# Patient Record
Sex: Female | Born: 1935 | Race: White | Hispanic: No | Marital: Married | Smoking: Former smoker
Health system: Southern US, Community
[De-identification: ages and names within clinical notes are randomized; demographics above are authoritative.]

## PROBLEM LIST (undated history)

## (undated) DIAGNOSIS — C50919 Malignant neoplasm of unspecified site of unspecified female breast: Secondary | ICD-10-CM

## (undated) DIAGNOSIS — G4731 Primary central sleep apnea: Secondary | ICD-10-CM

## (undated) DIAGNOSIS — E7800 Pure hypercholesterolemia, unspecified: Secondary | ICD-10-CM

## (undated) DIAGNOSIS — I10 Essential (primary) hypertension: Secondary | ICD-10-CM

## (undated) DIAGNOSIS — G4736 Sleep related hypoventilation in conditions classified elsewhere: Secondary | ICD-10-CM

## (undated) HISTORY — PX: APPENDECTOMY: SHX54

## (undated) HISTORY — DX: Sleep related hypoventilation in conditions classified elsewhere: G47.36

## (undated) HISTORY — PX: BREAST LUMPECTOMY: SHX2

## (undated) HISTORY — DX: Essential (primary) hypertension: I10

## (undated) HISTORY — DX: Primary central sleep apnea: G47.31

## (undated) HISTORY — DX: Pure hypercholesterolemia, unspecified: E78.00

## (undated) HISTORY — DX: Malignant neoplasm of unspecified site of unspecified female breast: C50.919

---

## 2008-02-24 ENCOUNTER — Ambulatory Visit: Payer: Self-pay | Admitting: Diagnostic Radiology

## 2008-02-24 ENCOUNTER — Ambulatory Visit (HOSPITAL_BASED_OUTPATIENT_CLINIC_OR_DEPARTMENT_OTHER): Admission: RE | Admit: 2008-02-24 | Discharge: 2008-02-24 | Payer: Self-pay | Admitting: Family Medicine

## 2010-05-22 ENCOUNTER — Ambulatory Visit (INDEPENDENT_AMBULATORY_CARE_PROVIDER_SITE_OTHER): Payer: Medicare Other | Admitting: Pulmonary Disease

## 2010-05-22 ENCOUNTER — Encounter: Payer: Self-pay | Admitting: Pulmonary Disease

## 2010-05-22 VITALS — BP 122/64 | HR 82 | Temp 98.7°F | Wt 211.6 lb

## 2010-05-22 DIAGNOSIS — G4730 Sleep apnea, unspecified: Secondary | ICD-10-CM

## 2010-05-22 DIAGNOSIS — G4731 Primary central sleep apnea: Secondary | ICD-10-CM

## 2010-05-22 HISTORY — DX: Primary central sleep apnea: G47.31

## 2010-05-22 NOTE — Assessment & Plan Note (Signed)
She has recent sleep study showing both obstructive and central sleep apnea.  She has a history of hypertension.  I have reviewed his sleep test results with the patient.  Explained how sleep apnea can affect the patient's health.  Driving precautions and importance of weight loss were discussed.  Treatment options for sleep apnea were reviewed.  Will proceed with CPAP titration study in lab.  Explained that she may also need to use supplemental oxygen, but this will determined after review of her titration study.

## 2010-05-22 NOTE — Patient Instructions (Signed)
Will schedule CPAP titration sleep study Will schedule follow up after sleep test reviewed

## 2010-05-22 NOTE — Progress Notes (Signed)
Subjective:    Patient ID: Kristy Parsons, female    DOB: July 10, 1933, 77 y.o.   MRN: [id number]  HPI 77 yo female for sleep evaluation.  She had breast surgery recently.  She had trouble recovering from anesthesia, and concerned was raised that she could have sleep apnea.  As a result she had a sleep test.  This was done at [facility] on Dec. 20, 2011.  This showed an AHI of 9.2 and SpO2 low of 77%.  This was associated with central events and consistent with mild complex sleep apnea.  There was some confusion as to what therapeutic options were available.  As a result she sought second opinion about what to do for her sleep apnea.  She goes to bed at 1am and falls asleep quickly.  She sleeps through the night.  She wakes up at 11am.  She feels good in the morning, and does not nap.  She does not use anything to help sleep or stay awake.  The patient denies sleep walking, sleep talking, bruxism, or nightmares.  There is no history of restless legs.  The patient denies sleep hallucinations, sleep paralysis, or cataplexy.  There is no history of thyroid disease.  She quit smoking years ago.  She does not drink alcohol.  Her weight has been steady.  Her Epworth score is 0.  Past Medical History  Diagnosis Date  . High blood pressure   . Asthma   . High cholesterol   . Breast cancer     DCIS     No family history on file.   History   Social History  . Marital Status: Married    Spouse Name: N/A    Number of Children: 2  . Years of Education: N/A   Occupational History  . house wife    Social History Main Topics  . Smoking status: Former Smoker -- 1.0 packs/day for 30 years    Types: Cigarettes    Quit date: 02/04/1980  . Smokeless tobacco: Not on file  . Alcohol Use: No  . Drug Use: No  . Sexually Active: Not on file   Other Topics Concern  . Not on file   Social History Narrative  . No narrative on file     Allergies  Allergen Reactions  . Bactrim  Rash  . Macrobid Rash     No outpatient prescriptions prior to visit.      Review of Systems     Objective:   Physical Exam Filed Vitals:   05/22/10 1549  BP: 122/64  Pulse: 82  Temp: 98.7 F (37.1 C)  TempSrc: Oral  Weight: 211 lb 9.6 oz (95.981 kg)  SpO2: 92%   General - Obese, healthy, no distress HEENT - PERRLA, EOMI, Narrow nasal angles, MP 2, decreased AP diameter, low laying soft palate, no LAN, no thyromegaly Cardiac - S1S2 regular, no murmur, peripheral pulses symmetric Chest - CTA Abd - soft, nontender, normal bowel sounds Ext - no E/C/C Neuro - normal strength, A&O x 3, CN intact Psych - normal mood and behavior       Assessment & Plan:   Complex sleep apnea syndrome She has recent sleep study showing both obstructive and central sleep apnea.  She has a history of hypertension.  I have reviewed his sleep test results with the patient.  Explained how sleep apnea can affect the patient's health.  Driving precautions and importance of weight loss were discussed.  Treatment options for sleep apnea  were reviewed.  Will proceed with CPAP titration study in lab.  Explained that she may also need to use supplemental oxygen, but this will determined after review of her titration study.    Updated Medication List Outpatient Encounter Prescriptions as of 05/22/2010  Medication Sig Dispense Refill  . amLODipine-valsartan (EXFORGE) 10-160 MG per tablet Take 1 tablet by mouth daily.        Marland Kitchen aspirin 81 MG tablet Take 81 mg by mouth daily.        . Calcium Carbonate 1500 MG TABS Take by mouth. Takes a total of 1700 mg a day       . fluticasone (FLOVENT HFA) 110 MCG/ACT inhaler Inhale 1 puff into the lungs 2 (two) times daily.        . Glucosamine 500 MG CAPS Take by mouth. Once a day       . Omega-3 Fatty Acids (FISH OIL) 1000 MG CAPS Take by mouth. Once a day       . rosuvastatin (CRESTOR) 5 MG tablet Take 5 mg by mouth daily.        . solifenacin (VESICARE) 5 MG  tablet 1/2 daily       . venlafaxine (EFFEXOR-XR) 75 MG 24 hr capsule Take 75 mg by mouth daily.

## 2010-06-18 ENCOUNTER — Ambulatory Visit (HOSPITAL_BASED_OUTPATIENT_CLINIC_OR_DEPARTMENT_OTHER): Payer: Medicare Other | Attending: Pulmonary Disease

## 2010-06-18 DIAGNOSIS — R259 Unspecified abnormal involuntary movements: Secondary | ICD-10-CM | POA: Insufficient documentation

## 2010-06-18 DIAGNOSIS — I10 Essential (primary) hypertension: Secondary | ICD-10-CM | POA: Insufficient documentation

## 2010-06-18 DIAGNOSIS — G4733 Obstructive sleep apnea (adult) (pediatric): Secondary | ICD-10-CM | POA: Insufficient documentation

## 2010-07-05 ENCOUNTER — Telehealth: Payer: Self-pay | Admitting: Pulmonary Disease

## 2010-07-05 NOTE — Telephone Encounter (Signed)
Dr Craige Cotta, please advise results, thanks

## 2010-07-09 ENCOUNTER — Telehealth: Payer: Self-pay | Admitting: Pulmonary Disease

## 2010-07-09 DIAGNOSIS — IMO0002 Reserved for concepts with insufficient information to code with codable children: Secondary | ICD-10-CM

## 2010-07-09 DIAGNOSIS — G4736 Sleep related hypoventilation in conditions classified elsewhere: Secondary | ICD-10-CM

## 2010-07-09 DIAGNOSIS — G4731 Primary central sleep apnea: Secondary | ICD-10-CM

## 2010-07-09 HISTORY — DX: Reserved for concepts with insufficient information to code with codable children: IMO0002

## 2010-07-09 NOTE — Telephone Encounter (Signed)
Discussed sleep study findings with pt.

## 2010-07-09 NOTE — Telephone Encounter (Signed)
She had titration to CPAP 9 cm H2O with control of apnea.  Had evidence for hypoventilation with oxygen desaturation in absence of apneic events.  Did well with additional of 2 liters oxygen.  Will proceed with CPAP 9 cm H2O and 2 liters oxygen.  Order sent to [employer] to set up DME.  Will have my nurse call to schedule ROV 8 weeks after set up.

## 2010-07-10 DIAGNOSIS — I10 Essential (primary) hypertension: Secondary | ICD-10-CM

## 2010-07-10 DIAGNOSIS — G4733 Obstructive sleep apnea (adult) (pediatric): Secondary | ICD-10-CM

## 2010-07-10 DIAGNOSIS — R259 Unspecified abnormal involuntary movements: Secondary | ICD-10-CM

## 2010-07-10 NOTE — Procedures (Addendum)
Kristy Parsons, CUPPETT NO.:  [id number]  MEDICAL RECORD NO.:  [id number]          PATIENT TYPE:  OUT  LOCATION:  SLEEP CENTER                 FACILITY:  [facility]  PHYSICIAN:  Coralyn Helling, MD        DATE OF BIRTH:  04-Aug-1933  DATE OF STUDY:  06/18/2010                           NOCTURNAL POLYSOMNOGRAM  REFERRING PHYSICIAN:  Coralyn Helling, MD  INDICATION FOR STUDY:  [name] is a 77 year old female who has a history of hypertension.  She also has sleep disruption, snoring, and daytime sleepiness.  She had undergone a recent overnight polysomnogram on January 22, 2010, and was found to have an apnea/hypopnea index of 9.2.  She is referred to the sleep lab for a CPAP titration study.  Height is 5 feet 7 inches, weight is 211 pounds, BMI is 33, neck size is 15 inches.  EPWORTH SLEEPINESS SCORE:  0.  MEDICATIONS:  Exforge, calcium, Flovent, glucosamine, fish oil, Crestor, VESIcare, and Effexor XR.  SLEEP ARCHITECTURE:  Total recording time was 390 minutes.  Total sleep time was 103 minutes.  Sleep efficiency was 26%.  Sleep latency 73 minutes.  The study was notable for lack of stage III sleep and REM sleep.  The patient slept exclusively in the non-supine position.  RESPIRATORY DATA:  The average respiratory rate was 19.  The patient was started on CPAP of 5 cm of water and increased to 9 cm of water.  With CPAP at 9 cm of water, the apnea/hypopnea index was 0.  However, the patient was not observed in REM sleep or supine sleep.  OXYGEN DATA:  The baseline oxygenation was 91%.  The oxygen saturation nadir was 82%.  The patient was noted to have significant oxygen desaturations of longer duration without evidence for apneic events.  As a result, she was started on 2 liters of supplemental oxygen.  With a combination of CPAP at 9 cm of water and 2 liters supplemental oxygen, she was able to maintain her oxygen saturation.  CARDIAC DATA:  The average heart rate  was 68 and the rhythm strip showed sinus rhythm with occasional PVCs.  MOVEMENT-PARASOMNIA:  The periodic limb movement index was 132, and the patient had no restroom trips.  IMPRESSIONS-RECOMMENDATIONS:  The patient had successful control of her obstructive sleep apnea with CPAP set at 9 cm of water.  In addition, she also appeared to have evidence for sleep-related hypoventilation which was successfully controlled with combination of CPAP at 9 cm of water and 2 liters of supplemental oxygen.  She also had a significant increase in the periodic limb movement index and clinical correlation would be necessary to determine the significance of this.  In addition to diet, exercise, and weight reduction, I would recommend the patient be started on CPAP at 9 cm of water with 2 liters of supplemental oxygen and monitored for her clinical response.     Coralyn Helling, MD Diplomat, American Board of Sleep Medicine Electronically Signed    VS/MEDQ  D:  07/09/2010 14:53:49  T:  07/10/2010 04:53:34  Job:  [id number]

## 2010-07-15 NOTE — Telephone Encounter (Signed)
Pt says she has not heard anything from DME, [employer]. Will forward to [facility]'s so they may find out when pt will be set up on CPAP.

## 2010-08-01 ENCOUNTER — Telehealth: Payer: Self-pay | Admitting: Pulmonary Disease

## 2010-08-01 DIAGNOSIS — G4731 Primary central sleep apnea: Secondary | ICD-10-CM

## 2010-08-01 NOTE — Telephone Encounter (Signed)
Pt c/o 9 being too much pressure. States she does press the Ramp button at night and it wakes her up. Also c/o "air blowing" across her body from her mask. Please advise. Thanks

## 2010-08-01 NOTE — Telephone Encounter (Signed)
Please inform patient that we will send order to her DME to decrease CPAP pressure to 8 cm H2O.  (I have sent this order).

## 2010-08-01 NOTE — Telephone Encounter (Signed)
I informed pt of VS's findings and recommendations. Pt verbalized understanding  

## 2010-08-26 ENCOUNTER — Encounter: Payer: Self-pay | Admitting: Pulmonary Disease

## 2010-09-05 ENCOUNTER — Encounter: Payer: Self-pay | Admitting: Pulmonary Disease

## 2010-09-09 ENCOUNTER — Ambulatory Visit (INDEPENDENT_AMBULATORY_CARE_PROVIDER_SITE_OTHER): Payer: Medicare Other | Admitting: Pulmonary Disease

## 2010-09-09 ENCOUNTER — Encounter: Payer: Self-pay | Admitting: Pulmonary Disease

## 2010-09-09 VITALS — BP 112/70 | HR 69 | Temp 98.1°F | Wt 215.0 lb

## 2010-09-09 DIAGNOSIS — G4731 Primary central sleep apnea: Secondary | ICD-10-CM

## 2010-09-09 DIAGNOSIS — G4736 Sleep related hypoventilation in conditions classified elsewhere: Secondary | ICD-10-CM

## 2010-09-09 DIAGNOSIS — G4739 Other sleep apnea: Secondary | ICD-10-CM

## 2010-09-09 DIAGNOSIS — G4730 Sleep apnea, unspecified: Secondary | ICD-10-CM

## 2010-09-09 NOTE — Patient Instructions (Signed)
Will change CPAP pressure to 6 cm H2O Will arrange for oxygen to be set up at 2 liters, and to be used with CPAP machine while asleep Will arrange for new CPAP mask Follow up in 4 to 6 weeks

## 2010-09-09 NOTE — Assessment & Plan Note (Signed)
She had continued oxygen desaturation during titration study in absence of obstructive apneic events.  She needs to use oxygen with CPAP.  She has not been set up with oxygen yet.  Will contact her DME company to ensure that oxygen is set up with CPAP.

## 2010-09-09 NOTE — Progress Notes (Signed)
  Subjective:    Patient ID: Kristy Parsons, female    DOB: 1934/02/03, 77 y.o.   MRN: [id number]  HPI 77 yo female with Complex sleep apnea, and OHS.  She has not been able to tolerate CPAP.  She has an nasal pillows mask.  She can fall asleep with CPAP with ramping procedure.  However, she is woken up when the pressure increases.  This has continued in spite of decreasing pressure from 9 to 8 cm H2O.  She has also noticed a moldy smell from her mask.  She was never set up with home oxygen.     Review of Systems     Objective:   Physical Exam BP 112/70  Pulse 69  Temp(Src) 98.1 F (36.7 C) (Oral)  Wt 215 lb (97.523 kg)  SpO2 92%  General - Obese, healthy, no distress  HEENT - Narrow nasal angles, MP 2, decreased AP diameter, low laying soft palate, no LAN  Cardiac - S1S2 regular, no murmur Chest - CTA  Abd - soft, nontender, normal bowel sounds  Ext - no E/C/C  Neuro - normal strength, A&O x 3, CN intact  Psych - normal mood and behavior   PSG from [facility] 01/22/10>>AHI 9.2, SpO2 low 77%.  Obstructive and central events. CPAP titration 06/18/10>>CPAP 9 cm H2O, 2 liters oxygen. CPAP download 07/25/10 to 08/23/10>>used on 15 of 30 nights with average 1 hrs 24 min.  Average AHI 1.1 with CPAP 8 cm H2O.    Assessment & Plan:

## 2010-09-09 NOTE — Assessment & Plan Note (Addendum)
She is able to fall asleep with CPAP, but feels that the pressure is too high after ramping procedure in spite of decreasing pressure from 9 to 8 cm H2O.    Will decreased pressure to 6 cm H2O.    Will arrange for new CPAP mask, and advised her to try using CPAP w/o humidifer to determine if this improves the problem with small from her makes.  If these changes are not successful, may then need to consider evaluation for oral appliance.

## 2010-10-04 ENCOUNTER — Telehealth: Payer: Self-pay | Admitting: *Deleted

## 2010-10-04 DIAGNOSIS — G4733 Obstructive sleep apnea (adult) (pediatric): Secondary | ICD-10-CM

## 2010-10-04 NOTE — Telephone Encounter (Signed)
Okay to send order for ONO with CPAP.

## 2010-10-04 NOTE — Telephone Encounter (Signed)
Spoke with Kristy Parsons [facility]) and she states in order for pt to get set up with o2 with CPAP pt will need ONO. Sleep study data from 06/18/2010 needs to be w/in 30 days to qualify and pt did not have OV. Please advise If I can send order. Thanks  Carver Fila, CMA

## 2010-10-04 NOTE — Telephone Encounter (Signed)
Order was sent to [facility] 

## 2010-10-24 ENCOUNTER — Telehealth: Payer: Self-pay | Admitting: Pulmonary Disease

## 2010-10-24 DIAGNOSIS — G4731 Primary central sleep apnea: Secondary | ICD-10-CM

## 2010-10-24 DIAGNOSIS — G4736 Sleep related hypoventilation in conditions classified elsewhere: Secondary | ICD-10-CM

## 2010-10-24 NOTE — Telephone Encounter (Signed)
Called and spoke with pt.  Pt was calling VS back to discuss her ONO results in detail (see results dated 10/24/10 in pt's chart) informed her of these results.  Pt verbalized understanding and denied any questions.  Nothing further needed.

## 2010-10-24 NOTE — Telephone Encounter (Signed)
ONO 10/22/10 with CPAP and room air>>Test time 8hrs 49 min.  Basal SpO2 91%, low SpO2 79%.  Spent 7 hrs 6 min (80.6%) with SpO2 < 88%.  Left message on pt's voicemail explain ONO results, and needed for supplemental oxygen at 2 liters with CPAP.  Will repeat ONO with CPAP and 2 liters oxygen.  Advised pt to call back if she has questions.

## 2010-10-30 ENCOUNTER — Encounter: Payer: Self-pay | Admitting: Pulmonary Disease

## 2019-02-28 ENCOUNTER — Ambulatory Visit: Payer: Medicare Other

## 2020-01-11 ENCOUNTER — Other Ambulatory Visit: Payer: Self-pay

## 2020-01-11 ENCOUNTER — Emergency Department (HOSPITAL_COMMUNITY)
Admission: EM | Admit: 2020-01-11 | Discharge: 2020-01-11 | Disposition: A | Discharge status: Home / Self Care | Payer: Medicare Other | Attending: Emergency Medicine | Admitting: Emergency Medicine

## 2020-01-11 ENCOUNTER — Emergency Department (HOSPITAL_COMMUNITY): Payer: Medicare Other

## 2020-01-11 ENCOUNTER — Encounter (HOSPITAL_COMMUNITY): Payer: Self-pay | Admitting: Emergency Medicine

## 2020-01-11 DIAGNOSIS — Z7982 Long term (current) use of aspirin: Secondary | ICD-10-CM | POA: Diagnosis not present

## 2020-01-11 DIAGNOSIS — Z7952 Long term (current) use of systemic steroids: Secondary | ICD-10-CM | POA: Insufficient documentation

## 2020-01-11 DIAGNOSIS — W19XXXA Unspecified fall, initial encounter: Secondary | ICD-10-CM

## 2020-01-11 DIAGNOSIS — Y9389 Activity, other specified: Secondary | ICD-10-CM | POA: Insufficient documentation

## 2020-01-11 DIAGNOSIS — Y92512 Supermarket, store or market as the place of occurrence of the external cause: Secondary | ICD-10-CM | POA: Insufficient documentation

## 2020-01-11 DIAGNOSIS — W01198A Fall on same level from slipping, tripping and stumbling with subsequent striking against other object, initial encounter: Secondary | ICD-10-CM | POA: Diagnosis not present

## 2020-01-11 DIAGNOSIS — Z853 Personal history of malignant neoplasm of breast: Secondary | ICD-10-CM | POA: Insufficient documentation

## 2020-01-11 DIAGNOSIS — S0990XA Unspecified injury of head, initial encounter: Secondary | ICD-10-CM

## 2020-01-11 DIAGNOSIS — Z23 Encounter for immunization: Secondary | ICD-10-CM | POA: Insufficient documentation

## 2020-01-11 DIAGNOSIS — J45909 Unspecified asthma, uncomplicated: Secondary | ICD-10-CM | POA: Diagnosis not present

## 2020-01-11 DIAGNOSIS — S0181XA Laceration without foreign body of other part of head, initial encounter: Secondary | ICD-10-CM

## 2020-01-11 DIAGNOSIS — S01112A Laceration without foreign body of left eyelid and periocular area, initial encounter: Secondary | ICD-10-CM | POA: Insufficient documentation

## 2020-01-11 DIAGNOSIS — Z87891 Personal history of nicotine dependence: Secondary | ICD-10-CM | POA: Diagnosis not present

## 2020-01-11 MED ORDER — TETANUS-DIPHTH-ACELL PERTUSSIS 5-2.5-18.5 LF-MCG/0.5 IM SUSY
0.5000 mL | PREFILLED_SYRINGE | Freq: Once | INTRAMUSCULAR | Status: AC
  Administered 2020-01-11: 0.5 mL via INTRAMUSCULAR
  Filled 2020-01-11: qty 0.5

## 2020-01-11 MED ORDER — LIDOCAINE HCL (PF) 1 % IJ SOLN
5.0000 mL | Freq: Once | INTRAMUSCULAR | Status: DC
  Filled 2020-01-11: qty 30

## 2020-01-11 NOTE — ED Provider Notes (Signed)
[place] DEPT Provider Note   CSN: [id number] Arrival date & time: 01/11/20  1449     History Chief Complaint  Patient presents with  . Fall  . Head Laceration    Kristy Parsons is a 86 y.o. female.  HPI Patient presents after fall.  States she tripped and fell at [employer].  Hit her left forehead.  No loss conscious.  States she landed on left side and some mild pain on that side but no severe pain.  No loss conscious.  Not on anticoagulation.  Does have a laceration left eyebrow.  Unknown last tetanus.  No visual disturbance.  No neck pain.  Has been ambulatory since the event.    Past Medical History:  Diagnosis Date  . Asthma   . Breast cancer ([place])    DCIS  . Complex sleep apnea syndrome 05/22/2010  . High blood pressure   . High cholesterol   . Sleep-related hypoventilation 07/09/2010    Patient Active Problem List   Diagnosis Date Noted  . Sleep-related hypoventilation 07/09/2010  . Complex sleep apnea syndrome 05/22/2010    Past Surgical History:  Procedure Laterality Date  . APPENDECTOMY    . BREAST LUMPECTOMY     right breast     OB History   No obstetric history on file.     History reviewed. No pertinent family history.  Social History   Tobacco Use  . Smoking status: Former Smoker    Packs/day: 1.00    Years: 30.00    Pack years: 30.00    Types: Cigarettes    Quit date: 02/04/1980    Years since quitting: 39.9  Substance Use Topics  . Alcohol use: No  . Drug use: No    Home Medications Prior to Admission medications   Medication Sig Start Date End Date Taking? Authorizing Provider  acetaminophen (TYLENOL) 500 MG tablet Take 500 mg by mouth every 6 (six) hours as needed for moderate pain.   Yes [provider]  amLODipine-valsartan (EXFORGE) 10-160 MG per tablet Take 1 tablet by mouth daily.     Yes [provider]  aspirin 81 MG tablet Take 81 mg by mouth daily.     Yes [provider]  Calcium Carbonate 1500 MG TABS Take 1,500 mg by mouth daily.    Yes [provider]  Carboxymethylcellulose Sodium (REFRESH PLUS OP) Place 1 drop into both eyes daily as needed (dry eyes).   Yes [provider]  clobetasol (TEMOVATE) 0.05 % external solution Apply 1 application topically 2 (two) times daily as needed (itching on scalp).  10/18/14  Yes [provider]  fluticasone furoate-vilanterol (BREO ELLIPTA) 100-25 MCG/INH AEPB Inhale 1 puff into the lungs daily.  11/29/19  Yes [provider]  Glucosamine 500 MG CAPS Take 500 mg by mouth daily. Once a day    Yes [provider]  omeprazole (PRILOSEC) 40 MG capsule Take 40 mg by mouth daily. 12/08/19  Yes [provider]  rosuvastatin (CRESTOR) 5 MG tablet Take 5 mg by mouth daily.     Yes [provider]  Trospium Chloride 60 MG CP24 Take 60 mg by mouth daily.   Yes [provider]  venlafaxine (EFFEXOR-XR) 75 MG 24 hr capsule Take 75 mg by mouth daily.     Yes [provider]    Allergies    Bactrim, Nitrofurantoin monohyd macro, Sulfa antibiotics, Azithromycin, Cortisone, and Ketorolac  Review of Systems  Review of Systems  Constitutional: Negative for appetite change.  HENT: Negative for congestion.   Respiratory: Negative for shortness of breath.   Gastrointestinal: Negative for abdominal pain.  Genitourinary: Negative for flank pain.  Musculoskeletal: Negative for back pain.  Skin: Positive for wound.  Neurological: Negative for weakness and headaches.  Psychiatric/Behavioral: Negative for confusion.    Physical Exam Updated Vital Signs BP (!) 158/70   Pulse 67   Temp 98.4 F (36.9 C) (Oral)   Resp 18   Ht 5\' 5"  (1.651 m)   Wt 90.7 kg   SpO2 93%   BMI 33.28 kg/m   Physical Exam Vitals and nursing note reviewed.  Constitutional:      Appearance: Normal appearance.  HENT:     Head:     Comments: 3 cm horizontal  laceration through left eyebrow.  Able to raise eyebrow.  Eye movements intact. Cardiovascular:     Rate and Rhythm: Regular rhythm.  Pulmonary:     Breath sounds: Normal breath sounds.  Chest:     Chest wall: No tenderness.  Abdominal:     Tenderness: There is no abdominal tenderness.  Musculoskeletal:        General: No tenderness.     Cervical back: Neck supple.  Skin:    General: Skin is warm.     Capillary Refill: Capillary refill takes less than 2 seconds.  Neurological:     Mental Status: She is alert and oriented to person, place, and time.  Psychiatric:        Mood and Affect: Mood normal.     ED Results / Procedures / Treatments   Labs (all labs ordered are listed, but only abnormal results are displayed) Labs Reviewed - No data to display  EKG None  Radiology CT Head Wo Contrast  Result Date: 01/11/2020 CLINICAL DATA:  Trauma. EXAM: CT HEAD WITHOUT CONTRAST TECHNIQUE: Contiguous axial images were obtained from the base of the skull through the vertex without intravenous contrast. COMPARISON:  CT head December 23, 2017. FINDINGS: Brain: No evidence of acute large vascular territory infarction, hemorrhage, hydrocephalus, extra-axial collection or mass lesion/mass effect. Chronic encephalomalacia in the right frontal lobe, similar to prior. Similar generalized cerebral atrophy with volume loss. Patchy white matter hypodensities, which are nonspecific but most likely relate to chronic microvascular ischemic disease. Vascular: Calcific atherosclerosis. Skull: Left frontal/periorbital contusion/laceration. No acute fracture. Hyperostosis frontalis. Sinuses/Orbits: Visualized sinuses are clear.  Unremarkable orbits. Other: No mastoid effusions. IMPRESSION: 1. No evidence of acute intracranial abnormality. 2. Left frontal/periorbital contusion/laceration without acute fracture. 3. Chronic microvascular ischemic disease, remote right frontal infarct, and generalized cerebral  atrophy. Electronically Signed   By: Margaretha Sheffield MD   On: 01/11/2020 18:42    Procedures .Marland KitchenLaceration Repair  Date/Time: 01/11/2020 7:26 PM Performed by: Davonna Belling, MD Authorized by: Davonna Belling, MD   Consent:    Consent obtained:  Verbal   Consent given by:  Patient   Risks discussed:  Infection, pain, retained foreign body, vascular damage, poor wound healing, poor cosmetic result, need for additional repair and nerve damage   Alternatives discussed:  No treatment, delayed treatment and observation Anesthesia (see MAR for exact dosages):    Anesthesia method:  Local infiltration   Local anesthetic:  Lidocaine 1% w/o epi Laceration details:    Location:  Face   Face location:  L eyebrow   Length (cm):  3 Repair type:    Repair type:  Intermediate Pre-procedure details:    Preparation:  Patient was prepped and draped in usual sterile fashion and imaging obtained to evaluate for foreign bodies Exploration:    Hemostasis achieved with:  Direct pressure   Wound exploration: wound explored through full range of motion and entire depth of wound probed and visualized     Wound extent: no tendon damage noted and no underlying fracture noted     Contaminated: no   Treatment:    Area cleansed with:  Saline   Amount of cleaning:  Standard   Irrigation method:  Syringe Subcutaneous repair:    Suture size:  5-0   Suture material:  Vicryl   Suture technique:  Simple interrupted   Number of sutures:  3 Skin repair:    Repair method:  Sutures   Suture size:  5-0   Suture material:  Prolene   Suture technique:  Simple interrupted   Number of sutures:  13 Post-procedure details:    Dressing:  Sterile dressing   Patient tolerance of procedure:  Tolerated well, no immediate complications   (including critical care time)  Medications Ordered in ED Medications  lidocaine (PF) (XYLOCAINE) 1 % injection 5 mL (has no administration in time range)  Tdap (BOOSTRIX)  injection 0.5 mL (0.5 mLs Intramuscular Given 01/11/20 1736)    ED Course  I have reviewed the triage vital signs and the nursing notes.  Pertinent labs & imaging results that were available during my care of the patient were reviewed by me and considered in my medical decision making (see chart for details).    MDM Rules/Calculators/A&P                         Patient mechanical fall.  Tripped over a little bump at the store.  Hit her left forehead.  Approximately 3 cm laceration closed.  Head CT done reassuring.  No intracranial hemorrhage.  No fracture.  No other injury.  Had been complaining of mild left-sided pain on her body but no tenderness to chest or abdomen.  Not on anticoagulation.  Do not think she needs further imaging.  No intrathoracic or intra-abdominal injury expected.  Discharge home. Final Clinical Impression(s) / ED Diagnoses Final diagnoses:  Fall, initial encounter  Laceration of forehead, initial encounter  Injury of head, initial encounter    Rx / DC Orders ED Discharge Orders    None       Davonna Belling, MD 01/11/20 1928

## 2020-01-11 NOTE — ED Triage Notes (Signed)
Pt states that she had a mechanical fall while shopping and now has a laceration over her L eyebrow. Denies blood thinners or LOC. Alert and oriented.

## 2020-01-11 NOTE — Discharge Instructions (Signed)
Have the stitches taken out in around 5 days.

## 2022-01-21 ENCOUNTER — Encounter (HOSPITAL_BASED_OUTPATIENT_CLINIC_OR_DEPARTMENT_OTHER): Payer: Self-pay

## 2022-01-21 ENCOUNTER — Emergency Department (HOSPITAL_BASED_OUTPATIENT_CLINIC_OR_DEPARTMENT_OTHER): Payer: Medicare Other

## 2022-01-21 ENCOUNTER — Other Ambulatory Visit: Payer: Self-pay

## 2022-01-21 ENCOUNTER — Inpatient Hospital Stay (HOSPITAL_BASED_OUTPATIENT_CLINIC_OR_DEPARTMENT_OTHER)
Admission: EM | Admit: 2022-01-21 | Discharge: 2022-01-26 | DRG: 492 | Disposition: A | Discharge status: Skilled Nursing Facility | Payer: Medicare Other | Attending: Osteopathic Medicine | Admitting: Osteopathic Medicine

## 2022-01-21 DIAGNOSIS — E876 Hypokalemia: Secondary | ICD-10-CM | POA: Diagnosis present

## 2022-01-21 DIAGNOSIS — R9431 Abnormal electrocardiogram [ECG] [EKG]: Secondary | ICD-10-CM

## 2022-01-21 DIAGNOSIS — Z882 Allergy status to sulfonamides status: Secondary | ICD-10-CM

## 2022-01-21 DIAGNOSIS — Z7982 Long term (current) use of aspirin: Secondary | ICD-10-CM

## 2022-01-21 DIAGNOSIS — Y92410 Unspecified street and highway as the place of occurrence of the external cause: Secondary | ICD-10-CM

## 2022-01-21 DIAGNOSIS — I160 Hypertensive urgency: Secondary | ICD-10-CM | POA: Diagnosis present

## 2022-01-21 DIAGNOSIS — S82141A Displaced bicondylar fracture of right tibia, initial encounter for closed fracture: Secondary | ICD-10-CM | POA: Diagnosis not present

## 2022-01-21 DIAGNOSIS — Z87891 Personal history of nicotine dependence: Secondary | ICD-10-CM

## 2022-01-21 DIAGNOSIS — Z853 Personal history of malignant neoplasm of breast: Secondary | ICD-10-CM

## 2022-01-21 DIAGNOSIS — S82143A Displaced bicondylar fracture of unspecified tibia, initial encounter for closed fracture: Secondary | ICD-10-CM | POA: Diagnosis not present

## 2022-01-21 DIAGNOSIS — I4892 Unspecified atrial flutter: Secondary | ICD-10-CM | POA: Diagnosis present

## 2022-01-21 DIAGNOSIS — D62 Acute posthemorrhagic anemia: Secondary | ICD-10-CM | POA: Diagnosis present

## 2022-01-21 DIAGNOSIS — J449 Chronic obstructive pulmonary disease, unspecified: Secondary | ICD-10-CM

## 2022-01-21 DIAGNOSIS — G4733 Obstructive sleep apnea (adult) (pediatric): Secondary | ICD-10-CM | POA: Diagnosis present

## 2022-01-21 DIAGNOSIS — E7800 Pure hypercholesterolemia, unspecified: Secondary | ICD-10-CM | POA: Diagnosis present

## 2022-01-21 DIAGNOSIS — J9611 Chronic respiratory failure with hypoxia: Secondary | ICD-10-CM

## 2022-01-21 DIAGNOSIS — J4489 Other specified chronic obstructive pulmonary disease: Secondary | ICD-10-CM | POA: Diagnosis present

## 2022-01-21 DIAGNOSIS — I10 Essential (primary) hypertension: Secondary | ICD-10-CM | POA: Diagnosis present

## 2022-01-21 DIAGNOSIS — Z79899 Other long term (current) drug therapy: Secondary | ICD-10-CM

## 2022-01-21 DIAGNOSIS — D72829 Elevated white blood cell count, unspecified: Secondary | ICD-10-CM

## 2022-01-21 DIAGNOSIS — I4891 Unspecified atrial fibrillation: Secondary | ICD-10-CM | POA: Diagnosis present

## 2022-01-21 DIAGNOSIS — Z888 Allergy status to other drugs, medicaments and biological substances status: Secondary | ICD-10-CM

## 2022-01-21 DIAGNOSIS — I471 Supraventricular tachycardia, unspecified: Secondary | ICD-10-CM | POA: Diagnosis present

## 2022-01-21 DIAGNOSIS — G4731 Primary central sleep apnea: Secondary | ICD-10-CM

## 2022-01-21 DIAGNOSIS — J9621 Acute and chronic respiratory failure with hypoxia: Secondary | ICD-10-CM | POA: Diagnosis not present

## 2022-01-21 DIAGNOSIS — J189 Pneumonia, unspecified organism: Secondary | ICD-10-CM | POA: Diagnosis not present

## 2022-01-21 LAB — CBC WITH DIFFERENTIAL/PLATELET
Abs Immature Granulocytes: 0.13 10*3/uL — ABNORMAL HIGH (ref 0.00–0.07)
Basophils Absolute: 0 10*3/uL (ref 0.0–0.1)
Basophils Relative: 0 %
Eosinophils Absolute: 0.1 10*3/uL (ref 0.0–0.5)
Eosinophils Relative: 0 %
HCT: 46 % (ref 36.0–46.0)
Hemoglobin: 14.6 g/dL (ref 12.0–15.0)
Immature Granulocytes: 1 %
Lymphocytes Relative: 6 %
Lymphs Abs: 1 10*3/uL (ref 0.7–4.0)
MCH: 27.9 pg (ref 26.0–34.0)
MCHC: 31.7 g/dL (ref 30.0–36.0)
MCV: 87.8 fL (ref 80.0–100.0)
Monocytes Absolute: 1.3 10*3/uL — ABNORMAL HIGH (ref 0.1–1.0)
Monocytes Relative: 7 %
Neutro Abs: 15.4 10*3/uL — ABNORMAL HIGH (ref 1.7–7.7)
Neutrophils Relative %: 86 %
Platelets: 232 10*3/uL (ref 150–400)
RBC: 5.24 MIL/uL — ABNORMAL HIGH (ref 3.87–5.11)
RDW: 14.8 % (ref 11.5–15.5)
WBC: 17.9 10*3/uL — ABNORMAL HIGH (ref 4.0–10.5)
nRBC: 0 % (ref 0.0–0.2)

## 2022-01-21 LAB — COMPREHENSIVE METABOLIC PANEL
ALT: 25 U/L (ref 0–44)
AST: 39 U/L (ref 15–41)
Albumin: 3.6 g/dL (ref 3.5–5.0)
Alkaline Phosphatase: 80 U/L (ref 38–126)
Anion gap: 8 (ref 5–15)
BUN: 13 mg/dL (ref 8–23)
CO2: 27 mmol/L (ref 22–32)
Calcium: 9.1 mg/dL (ref 8.9–10.3)
Chloride: 108 mmol/L (ref 98–111)
Creatinine, Ser: 0.79 mg/dL (ref 0.44–1.00)
GFR, Estimated: >60 mL/min (ref >60)
Glucose, Bld: 168 mg/dL — ABNORMAL HIGH (ref 70–99)
Potassium: 3.1 mmol/L — ABNORMAL LOW (ref 3.5–5.1)
Sodium: 143 mmol/L (ref 135–145)
Total Bilirubin: 0.9 mg/dL (ref 0.3–1.2)
Total Protein: 6.3 g/dL — ABNORMAL LOW (ref 6.5–8.1)

## 2022-01-21 MED ORDER — HYDROMORPHONE HCL 1 MG/ML IJ SOLN
0.5000 mg | Freq: Once | INTRAMUSCULAR | Status: AC
  Administered 2022-01-21: 0.5 mg via INTRAVENOUS
  Filled 2022-01-21: qty 1

## 2022-01-21 NOTE — ED Notes (Signed)
Patient placed in C-collar by EMS.
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
## 2022-01-21 NOTE — ED Notes (Signed)
Patient transported to CT 

## 2022-01-21 NOTE — ED Notes (Signed)
This RN noticed patient's distal phalange of middle finger on right hand completely bruised, swollen, and "tight." Bruising was not present on initial assessment. Patient denies finger pain; notified ED provider Meghan, PA-C.
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
## 2022-01-21 NOTE — ED Notes (Signed)
Patient transported to X-ray 
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
## 2022-01-22 ENCOUNTER — Inpatient Hospital Stay (HOSPITAL_COMMUNITY): Payer: Medicare Other

## 2022-01-22 ENCOUNTER — Inpatient Hospital Stay (HOSPITAL_COMMUNITY): Payer: Medicare Other | Admitting: Certified Registered Nurse Anesthetist

## 2022-01-22 ENCOUNTER — Encounter (HOSPITAL_COMMUNITY): Payer: Self-pay

## 2022-01-22 ENCOUNTER — Encounter (HOSPITAL_COMMUNITY)
Admission: EM | Disposition: A | Discharge status: Skilled Nursing Facility | Payer: Self-pay | Source: Home / Self Care | Attending: Internal Medicine

## 2022-01-22 ENCOUNTER — Other Ambulatory Visit: Payer: Self-pay

## 2022-01-22 ENCOUNTER — Encounter (HOSPITAL_COMMUNITY): Payer: Self-pay | Admitting: Family Medicine

## 2022-01-22 DIAGNOSIS — R9431 Abnormal electrocardiogram [ECG] [EKG]: Secondary | ICD-10-CM | POA: Diagnosis present

## 2022-01-22 DIAGNOSIS — E876 Hypokalemia: Secondary | ICD-10-CM | POA: Diagnosis present

## 2022-01-22 DIAGNOSIS — J4489 Other specified chronic obstructive pulmonary disease: Secondary | ICD-10-CM | POA: Diagnosis present

## 2022-01-22 DIAGNOSIS — I4891 Unspecified atrial fibrillation: Secondary | ICD-10-CM | POA: Diagnosis present

## 2022-01-22 DIAGNOSIS — I10 Essential (primary) hypertension: Secondary | ICD-10-CM | POA: Diagnosis present

## 2022-01-22 DIAGNOSIS — Z87891 Personal history of nicotine dependence: Secondary | ICD-10-CM | POA: Diagnosis not present

## 2022-01-22 DIAGNOSIS — Y92410 Unspecified street and highway as the place of occurrence of the external cause: Secondary | ICD-10-CM | POA: Diagnosis not present

## 2022-01-22 DIAGNOSIS — S82141A Displaced bicondylar fracture of right tibia, initial encounter for closed fracture: Secondary | ICD-10-CM | POA: Diagnosis present

## 2022-01-22 DIAGNOSIS — J189 Pneumonia, unspecified organism: Secondary | ICD-10-CM | POA: Diagnosis not present

## 2022-01-22 DIAGNOSIS — I4892 Unspecified atrial flutter: Secondary | ICD-10-CM | POA: Diagnosis present

## 2022-01-22 DIAGNOSIS — I160 Hypertensive urgency: Secondary | ICD-10-CM | POA: Diagnosis present

## 2022-01-22 DIAGNOSIS — G4731 Primary central sleep apnea: Secondary | ICD-10-CM

## 2022-01-22 DIAGNOSIS — Z853 Personal history of malignant neoplasm of breast: Secondary | ICD-10-CM | POA: Diagnosis not present

## 2022-01-22 DIAGNOSIS — Z882 Allergy status to sulfonamides status: Secondary | ICD-10-CM | POA: Diagnosis not present

## 2022-01-22 DIAGNOSIS — D62 Acute posthemorrhagic anemia: Secondary | ICD-10-CM | POA: Diagnosis present

## 2022-01-22 DIAGNOSIS — J449 Chronic obstructive pulmonary disease, unspecified: Secondary | ICD-10-CM

## 2022-01-22 DIAGNOSIS — J9611 Chronic respiratory failure with hypoxia: Secondary | ICD-10-CM | POA: Diagnosis present

## 2022-01-22 DIAGNOSIS — Z7982 Long term (current) use of aspirin: Secondary | ICD-10-CM | POA: Diagnosis not present

## 2022-01-22 DIAGNOSIS — E7800 Pure hypercholesterolemia, unspecified: Secondary | ICD-10-CM | POA: Diagnosis present

## 2022-01-22 DIAGNOSIS — J9621 Acute and chronic respiratory failure with hypoxia: Secondary | ICD-10-CM | POA: Diagnosis not present

## 2022-01-22 DIAGNOSIS — Z79899 Other long term (current) drug therapy: Secondary | ICD-10-CM | POA: Diagnosis not present

## 2022-01-22 DIAGNOSIS — I471 Supraventricular tachycardia, unspecified: Secondary | ICD-10-CM | POA: Diagnosis present

## 2022-01-22 DIAGNOSIS — S82143A Displaced bicondylar fracture of unspecified tibia, initial encounter for closed fracture: Secondary | ICD-10-CM | POA: Diagnosis present

## 2022-01-22 DIAGNOSIS — Z888 Allergy status to other drugs, medicaments and biological substances status: Secondary | ICD-10-CM | POA: Diagnosis not present

## 2022-01-22 DIAGNOSIS — G4733 Obstructive sleep apnea (adult) (pediatric): Secondary | ICD-10-CM | POA: Diagnosis present

## 2022-01-22 HISTORY — PX: ORIF TIBIA PLATEAU: SHX2132

## 2022-01-22 LAB — CBC
HCT: 45.4 % (ref 36.0–46.0)
Hemoglobin: 14.1 g/dL (ref 12.0–15.0)
MCH: 27.9 pg (ref 26.0–34.0)
MCHC: 31.1 g/dL (ref 30.0–36.0)
MCV: 89.7 fL (ref 80.0–100.0)
Platelets: 234 10*3/uL (ref 150–400)
RBC: 5.06 MIL/uL (ref 3.87–5.11)
RDW: 14.9 % (ref 11.5–15.5)
WBC: 12.9 10*3/uL — ABNORMAL HIGH (ref 4.0–10.5)
nRBC: 0 % (ref 0.0–0.2)

## 2022-01-22 LAB — BASIC METABOLIC PANEL
Anion gap: 10 (ref 5–15)
BUN: 10 mg/dL (ref 8–23)
CO2: 27 mmol/L (ref 22–32)
Calcium: 9.1 mg/dL (ref 8.9–10.3)
Chloride: 108 mmol/L (ref 98–111)
Creatinine, Ser: 0.83 mg/dL (ref 0.44–1.00)
GFR, Estimated: >60 mL/min (ref >60)
Glucose, Bld: 170 mg/dL — ABNORMAL HIGH (ref 70–99)
Potassium: 3.1 mmol/L — ABNORMAL LOW (ref 3.5–5.1)
Sodium: 145 mmol/L (ref 135–145)

## 2022-01-22 LAB — MAGNESIUM: Magnesium: 2 mg/dL (ref 1.7–2.4)

## 2022-01-22 LAB — TSH: TSH: 1.841 u[IU]/mL (ref 0.350–4.500)

## 2022-01-22 LAB — SURGICAL PCR SCREEN
MRSA, PCR: NEGATIVE
Staphylococcus aureus: POSITIVE — AB

## 2022-01-22 LAB — GLUCOSE, CAPILLARY: Glucose-Capillary: 167 mg/dL — ABNORMAL HIGH (ref 70–99)

## 2022-01-22 SURGERY — OPEN REDUCTION INTERNAL FIXATION (ORIF) TIBIAL PLATEAU
Anesthesia: Spinal | Laterality: Right

## 2022-01-22 MED ORDER — FENTANYL CITRATE (PF) 100 MCG/2ML IJ SOLN
INTRAMUSCULAR | Status: AC
  Filled 2022-01-22: qty 2

## 2022-01-22 MED ORDER — CEFAZOLIN SODIUM-DEXTROSE 2-4 GM/100ML-% IV SOLN
2.0000 g | INTRAVENOUS | Status: AC
  Administered 2022-01-22: 2 g via INTRAVENOUS
  Filled 2022-01-22: qty 100

## 2022-01-22 MED ORDER — LACTATED RINGERS IV SOLN: INTRAVENOUS | Status: DC

## 2022-01-22 MED ORDER — METOPROLOL TARTRATE 5 MG/5ML IV SOLN
5.0000 mg | INTRAVENOUS | Status: DC | PRN
  Administered 2022-01-25: 5 mg via INTRAVENOUS
  Filled 2022-01-22 (×2): qty 5

## 2022-01-22 MED ORDER — ACETAMINOPHEN 325 MG PO TABS
650.0000 mg | ORAL_TABLET | Freq: Four times a day (QID) | ORAL | Status: DC | PRN
  Administered 2022-01-24: 650 mg via ORAL
  Filled 2022-01-22: qty 2

## 2022-01-22 MED ORDER — PROPOFOL 10 MG/ML IV BOLUS
INTRAVENOUS | Status: AC
  Filled 2022-01-22: qty 20

## 2022-01-22 MED ORDER — VANCOMYCIN HCL 1000 MG IV SOLR
INTRAVENOUS | Status: AC
  Filled 2022-01-22: qty 20

## 2022-01-22 MED ORDER — LABETALOL HCL 5 MG/ML IV SOLN: 10.0000 mg | INTRAVENOUS | Status: DC | PRN

## 2022-01-22 MED ORDER — ROSUVASTATIN CALCIUM 5 MG PO TABS
5.0000 mg | ORAL_TABLET | Freq: Every day | ORAL | Status: DC
  Administered 2022-01-23 – 2022-01-26 (×4): 5 mg via ORAL
  Filled 2022-01-22 (×5): qty 1

## 2022-01-22 MED ORDER — PROPOFOL 10 MG/ML IV BOLUS
INTRAVENOUS | Status: DC | PRN
  Administered 2022-01-22: 10 mg via INTRAVENOUS
  Administered 2022-01-22: 20 mg via INTRAVENOUS
  Administered 2022-01-22: 10 mg via INTRAVENOUS

## 2022-01-22 MED ORDER — VANCOMYCIN HCL 1000 MG IV SOLR
INTRAVENOUS | Status: DC | PRN
  Administered 2022-01-22: 1000 mg

## 2022-01-22 MED ORDER — POLYETHYLENE GLYCOL 3350 17 G PO PACK: 17.0000 g | PACK | Freq: Every day | ORAL | Status: DC | PRN

## 2022-01-22 MED ORDER — 0.9 % SODIUM CHLORIDE (POUR BTL) OPTIME
TOPICAL | Status: DC | PRN
  Administered 2022-01-22: 1000 mL

## 2022-01-22 MED ORDER — METOCLOPRAMIDE HCL 5 MG PO TABS: 5.0000 mg | ORAL_TABLET | Freq: Three times a day (TID) | ORAL | Status: DC | PRN

## 2022-01-22 MED ORDER — POTASSIUM CHLORIDE 10 MEQ/100ML IV SOLN
10.0000 meq | INTRAVENOUS | Status: AC
  Administered 2022-01-22 (×3): 10 meq via INTRAVENOUS
  Filled 2022-01-22 (×3): qty 100

## 2022-01-22 MED ORDER — ENOXAPARIN SODIUM 40 MG/0.4ML IJ SOSY
40.0000 mg | PREFILLED_SYRINGE | INTRAMUSCULAR | Status: DC
  Administered 2022-01-23 – 2022-01-26 (×4): 40 mg via SUBCUTANEOUS
  Filled 2022-01-22 (×4): qty 0.4

## 2022-01-22 MED ORDER — FENTANYL CITRATE (PF) 250 MCG/5ML IJ SOLN
INTRAMUSCULAR | Status: DC | PRN
  Administered 2022-01-22: 25 ug via INTRAVENOUS

## 2022-01-22 MED ORDER — PANTOPRAZOLE SODIUM 40 MG PO TBEC
40.0000 mg | DELAYED_RELEASE_TABLET | Freq: Every day | ORAL | Status: DC
  Administered 2022-01-23 – 2022-01-26 (×4): 40 mg via ORAL
  Filled 2022-01-22 (×4): qty 1

## 2022-01-22 MED ORDER — PROPOFOL 500 MG/50ML IV EMUL
INTRAVENOUS | Status: DC | PRN
  Administered 2022-01-22: 25 ug/kg/min via INTRAVENOUS

## 2022-01-22 MED ORDER — DOCUSATE SODIUM 100 MG PO CAPS
100.0000 mg | ORAL_CAPSULE | Freq: Two times a day (BID) | ORAL | Status: DC
  Administered 2022-01-22 – 2022-01-24 (×6): 100 mg via ORAL
  Filled 2022-01-22 (×7): qty 1

## 2022-01-22 MED ORDER — POTASSIUM CHLORIDE IN NACL 20-0.9 MEQ/L-% IV SOLN
INTRAVENOUS | Status: DC
  Filled 2022-01-22: qty 1000

## 2022-01-22 MED ORDER — CHLORHEXIDINE GLUCONATE 0.12 % MT SOLN
15.0000 mL | Freq: Once | OROMUCOSAL | Status: AC
  Administered 2022-01-22: 15 mL via OROMUCOSAL
  Filled 2022-01-22: qty 15

## 2022-01-22 MED ORDER — HYDROCODONE-ACETAMINOPHEN 5-325 MG PO TABS: 1.0000 | ORAL_TABLET | ORAL | Status: DC | PRN

## 2022-01-22 MED ORDER — BUPIVACAINE IN DEXTROSE 0.75-8.25 % IT SOLN
INTRATHECAL | Status: DC | PRN
  Administered 2022-01-22: 1.8 mL via INTRATHECAL

## 2022-01-22 MED ORDER — AMLODIPINE BESYLATE-VALSARTAN 10-160 MG PO TABS: 1.0000 | ORAL_TABLET | Freq: Every day | ORAL | Status: DC

## 2022-01-22 MED ORDER — HYDROCODONE-ACETAMINOPHEN 5-325 MG PO TABS
1.0000 | ORAL_TABLET | ORAL | Status: DC | PRN
  Administered 2022-01-22: 1 via ORAL
  Administered 2022-01-22 – 2022-01-26 (×4): 2 via ORAL
  Filled 2022-01-22 (×4): qty 2
  Filled 2022-01-22: qty 1

## 2022-01-22 MED ORDER — ORAL CARE MOUTH RINSE: 15.0000 mL | Freq: Once | OROMUCOSAL | Status: AC

## 2022-01-22 MED ORDER — FENTANYL CITRATE (PF) 100 MCG/2ML IJ SOLN
25.0000 ug | INTRAMUSCULAR | Status: DC | PRN
  Administered 2022-01-22 (×2): 25 ug via INTRAVENOUS

## 2022-01-22 MED ORDER — OXYCODONE HCL 5 MG PO TABS: 5.0000 mg | ORAL_TABLET | Freq: Once | ORAL | Status: DC | PRN

## 2022-01-22 MED ORDER — METOCLOPRAMIDE HCL 5 MG/ML IJ SOLN: 5.0000 mg | Freq: Three times a day (TID) | INTRAMUSCULAR | Status: DC | PRN

## 2022-01-22 MED ORDER — VENLAFAXINE HCL ER 75 MG PO CP24
75.0000 mg | ORAL_CAPSULE | Freq: Every day | ORAL | Status: DC
  Filled 2022-01-22: qty 1

## 2022-01-22 MED ORDER — FENTANYL CITRATE PF 50 MCG/ML IJ SOSY
25.0000 ug | PREFILLED_SYRINGE | INTRAMUSCULAR | Status: DC | PRN
  Administered 2022-01-22: 25 ug via INTRAVENOUS
  Filled 2022-01-22: qty 1

## 2022-01-22 MED ORDER — FESOTERODINE FUMARATE ER 4 MG PO TB24
4.0000 mg | ORAL_TABLET | Freq: Every day | ORAL | Status: DC
  Administered 2022-01-23 – 2022-01-26 (×4): 4 mg via ORAL
  Filled 2022-01-22 (×5): qty 1

## 2022-01-22 MED ORDER — PHENYLEPHRINE HCL-NACL 20-0.9 MG/250ML-% IV SOLN
INTRAVENOUS | Status: DC | PRN
  Administered 2022-01-22: 50 ug/min via INTRAVENOUS

## 2022-01-22 MED ORDER — TROSPIUM CHLORIDE ER 60 MG PO CP24: 60.0000 mg | ORAL_CAPSULE | Freq: Every day | ORAL | Status: DC

## 2022-01-22 MED ORDER — METOPROLOL TARTRATE 5 MG/5ML IV SOLN: 5.0000 mg | INTRAVENOUS | Status: DC | PRN

## 2022-01-22 MED ORDER — ONDANSETRON HCL 4 MG/2ML IJ SOLN
INTRAMUSCULAR | Status: DC | PRN
  Administered 2022-01-22: 4 mg via INTRAVENOUS

## 2022-01-22 MED ORDER — OXYCODONE HCL 5 MG/5ML PO SOLN: 5.0000 mg | Freq: Once | ORAL | Status: DC | PRN

## 2022-01-22 MED ORDER — ONDANSETRON HCL 4 MG/2ML IJ SOLN
INTRAMUSCULAR | Status: AC
  Filled 2022-01-22: qty 2

## 2022-01-22 MED ORDER — MAGNESIUM SULFATE IN D5W 1-5 GM/100ML-% IV SOLN
1.0000 g | Freq: Once | INTRAVENOUS | Status: AC
  Administered 2022-01-22: 1 g via INTRAVENOUS
  Filled 2022-01-22: qty 100

## 2022-01-22 MED ORDER — FENTANYL CITRATE (PF) 250 MCG/5ML IJ SOLN
INTRAMUSCULAR | Status: AC
  Filled 2022-01-22: qty 5

## 2022-01-22 MED ORDER — FLUTICASONE FUROATE-VILANTEROL 100-25 MCG/ACT IN AEPB
1.0000 | INHALATION_SPRAY | Freq: Every day | RESPIRATORY_TRACT | Status: DC
  Administered 2022-01-22 – 2022-01-26 (×2): 1 via RESPIRATORY_TRACT
  Filled 2022-01-22 (×2): qty 28

## 2022-01-22 MED ORDER — HYDROMORPHONE HCL 1 MG/ML IJ SOLN
0.5000 mg | INTRAMUSCULAR | Status: DC | PRN
  Administered 2022-01-22: 0.5 mg via INTRAVENOUS
  Filled 2022-01-22: qty 1

## 2022-01-22 SURGICAL SUPPLY — 63 items
APL PRP STRL LF DISP 70% ISPRP (MISCELLANEOUS) ×2
BAG COUNTER SPONGE SURGICOUNT (BAG) ×1 IMPLANT
BAG SPNG CNTER NS LX DISP (BAG) ×1
BANDAGE ESMARK 6X9 LF (GAUZE/BANDAGES/DRESSINGS) ×1 IMPLANT
BIT DRILL CALIBR QC 2.5X250 (BIT) IMPLANT
BIT DRILL QC SFS 2.8X200 (BIT) IMPLANT
BLADE CLIPPER SURG (BLADE) IMPLANT
BLADE SURG 15 STRL LF DISP TIS (BLADE) ×1 IMPLANT
BLADE SURG 15 STRL SS (BLADE) ×1
BNDG CMPR 9X6 STRL LF SNTH (GAUZE/BANDAGES/DRESSINGS) ×1
BNDG ELASTIC 4X5.8 VLCR STR LF (GAUZE/BANDAGES/DRESSINGS) IMPLANT
BNDG ELASTIC 6X5.8 VLCR STR LF (GAUZE/BANDAGES/DRESSINGS) IMPLANT
BNDG ESMARK 6X9 LF (GAUZE/BANDAGES/DRESSINGS) ×1
BRUSH SCRUB EZ PLAIN DRY (MISCELLANEOUS) ×2 IMPLANT
CANISTER SUCT 3000ML PPV (MISCELLANEOUS) ×1 IMPLANT
CHLORAPREP W/TINT 26 (MISCELLANEOUS) ×2 IMPLANT
COVER SURGICAL LIGHT HANDLE (MISCELLANEOUS) ×1 IMPLANT
CUFF TOURN SGL QUICK 34 (TOURNIQUET CUFF) ×1
CUFF TRNQT CYL 34X4.125X (TOURNIQUET CUFF) ×1 IMPLANT
DRAPE C-ARM 42X72 X-RAY (DRAPES) ×1 IMPLANT
DRAPE C-ARMOR (DRAPES) ×1 IMPLANT
DRAPE ORTHO SPLIT 77X108 STRL (DRAPES) ×2
DRAPE SURG ORHT 6 SPLT 77X108 (DRAPES) ×2 IMPLANT
DRAPE U-SHAPE 47X51 STRL (DRAPES) ×1 IMPLANT
DRSG MEPILEX POST OP 4X12 (GAUZE/BANDAGES/DRESSINGS) IMPLANT
ELECT REM PT RETURN 9FT ADLT (ELECTROSURGICAL) ×1
ELECTRODE REM PT RTRN 9FT ADLT (ELECTROSURGICAL) ×1 IMPLANT
GLOVE BIO SURGEON STRL SZ 6.5 (GLOVE) ×3 IMPLANT
GLOVE BIO SURGEON STRL SZ7.5 (GLOVE) ×4 IMPLANT
GLOVE BIOGEL PI IND STRL 6.5 (GLOVE) ×1 IMPLANT
GLOVE BIOGEL PI IND STRL 7.5 (GLOVE) ×1 IMPLANT
GOWN STRL REUS W/ TWL LRG LVL3 (GOWN DISPOSABLE) ×2 IMPLANT
GOWN STRL REUS W/TWL LRG LVL3 (GOWN DISPOSABLE) ×2
K-WIRE 1.6X150 (WIRE) ×1
KIT BASIN OR (CUSTOM PROCEDURE TRAY) ×1 IMPLANT
KIT TURNOVER KIT B (KITS) ×1 IMPLANT
KWIRE 1.6X150 (WIRE) IMPLANT
NDL SUT 6 .5 CRC .975X.05 MAYO (NEEDLE) ×1 IMPLANT
NEEDLE MAYO TAPER (NEEDLE) ×2
NS IRRIG 1000ML POUR BTL (IV SOLUTION) ×1 IMPLANT
PACK TOTAL JOINT (CUSTOM PROCEDURE TRAY) ×1 IMPLANT
PAD ARMBOARD 7.5X6 YLW CONV (MISCELLANEOUS) ×2 IMPLANT
PAD CAST 4YDX4 CTTN HI CHSV (CAST SUPPLIES) IMPLANT
PADDING CAST COTTON 4X4 STRL (CAST SUPPLIES) ×1
PADDING CAST COTTON 6X4 STRL (CAST SUPPLIES) IMPLANT
PLATE LOCK VA-LCP F/3.5X117 6H (Plate) IMPLANT
SCREW CORTEX 3.5X75MM (Screw) IMPLANT
SCREW LOCK CORT ST 3.5X28 (Screw) IMPLANT
SCREW LOCK CORT ST 3.5X32 (Screw) IMPLANT
SCREW LOCK CORT ST 3.5X40 (Screw) IMPLANT
SCREW LOCKING 3.5X70MM VA (Screw) IMPLANT
SCREW LOCKING VA 3.5X75MM (Screw) IMPLANT
STAPLER VISISTAT 35W (STAPLE) ×1 IMPLANT
SUCTION FRAZIER HANDLE 10FR (MISCELLANEOUS) ×1
SUCTION TUBE FRAZIER 10FR DISP (MISCELLANEOUS) ×1 IMPLANT
SUT ETHILON 2 0 FS 18 (SUTURE) IMPLANT
SUT MNCRL AB 3-0 PS2 27 (SUTURE) IMPLANT
SUT VIC AB 1 CT1 18XCR BRD 8 (SUTURE) IMPLANT
SUT VIC AB 1 CT1 8-18 (SUTURE) ×1
SUT VIC AB 2-0 CT1 27 (SUTURE) ×2
SUT VIC AB 2-0 CT1 TAPERPNT 27 (SUTURE) IMPLANT
TOWEL GREEN STERILE (TOWEL DISPOSABLE) ×2 IMPLANT
WATER STERILE IRR 1000ML POUR (IV SOLUTION) ×2 IMPLANT
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
## 2022-01-22 NOTE — Progress Notes (Signed)
No charge FU note.  Patient seen and examined this PM after surgery. Tolerated well has no complaints. Echo is pending, remains in rate controlled Afib. Discussed with Ortho, okay to start [facility] for Afib in AM.

## 2022-01-22 NOTE — Anesthesia Procedure Notes (Signed)
Spinal  Patient location during procedure: OR Start time: 01/22/2022 10:45 AM End time: 01/22/2022 10:51 AM Reason for block: surgical anesthesia Staffing Performed: anesthesiologist  Anesthesiologist: Albertha Ghee, MD Performed by: Albertha Ghee, MD Authorized by: Albertha Ghee, MD   Preanesthetic Checklist Completed: patient identified, IV checked, risks and benefits discussed, surgical consent, monitors and equipment checked, pre-op evaluation and timeout performed Spinal Block Patient position: right lateral decubitus Prep: DuraPrep Patient monitoring: cardiac monitor, continuous pulse ox and blood pressure Approach: midline Location: L3-4 Injection technique: single-shot Needle Needle type: Pencan  Needle gauge: 24 G Needle length: 9 cm Assessment Sensory level: T10 Events: CSF return Additional Notes Functioning IV was confirmed and monitors were applied. Sterile prep and drape, including hand hygiene and sterile gloves were used. The patient was positioned and the spine was prepped. The skin was anesthetized with lidocaine.  Free flow of clear CSF was obtained prior to injecting local anesthetic into the CSF.  The spinal needle aspirated freely following injection.  The needle was carefully withdrawn.  The patient tolerated the procedure well.

## 2022-01-22 NOTE — Progress Notes (Signed)
Pt. Assessed by RT and RN at bedside.Unable to place pt. On cpap at this time due to  Pt. Appearing to be confused and weak. Pt. Currently tolerating nasal cannula at this time.
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
## 2022-01-22 NOTE — Anesthesia Preprocedure Evaluation (Signed)
Anesthesia Evaluation  Patient identified by MRN, date of birth, ID band Patient awake    Reviewed: Allergy & Precautions, H&P , NPO status , Patient's Chart, lab work & pertinent test results  Airway Mallampati: II   Neck ROM: full    Dental   Pulmonary asthma , sleep apnea , COPD, former smoker   breath sounds clear to auscultation       Cardiovascular hypertension,  Rhythm:regular Rate:Normal     Neuro/Psych    GI/Hepatic   Endo/Other    Renal/GU      Musculoskeletal   Abdominal   Peds  Hematology   Anesthesia Other Findings   Reproductive/Obstetrics                             Anesthesia Physical Anesthesia Plan  ASA: 3  Anesthesia Plan: General   Post-op Pain Management:    Induction: Intravenous  PONV Risk Score and Plan: 3 and Ondansetron, Dexamethasone and Treatment may vary due to age or medical condition  Airway Management Planned: Oral ETT  Additional Equipment:   Intra-op Plan:   Post-operative Plan: Extubation in OR  Informed Consent: I have reviewed the patients History and Physical, chart, labs and discussed the procedure including the risks, benefits and alternatives for the proposed anesthesia with the patient or authorized representative who has indicated his/her understanding and acceptance.     Dental advisory given  Plan Discussed with: CRNA, Anesthesiologist and Surgeon  Anesthesia Plan Comments:        Anesthesia Quick Evaluation

## 2022-01-22 NOTE — ED Notes (Signed)
Called patient's daughter, Mrs. Jake Michaelis to inform her of patient's bed ready at [facility]; left voicemail to call back.

## 2022-01-22 NOTE — Plan of Care (Signed)
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
## 2022-01-22 NOTE — Anesthesia Procedure Notes (Signed)
Procedure Name: MAC Date/Time: 01/22/2022 10:30 AM  Performed by: Inda Coke, CRNAPre-anesthesia Checklist: Patient identified, Emergency Drugs available, Suction available, Timeout performed and Patient being monitored Patient Re-evaluated:Patient Re-evaluated prior to induction Oxygen Delivery Method: Simple face mask Induction Type: IV induction Dental Injury: Teeth and Oropharynx as per pre-operative assessment

## 2022-01-22 NOTE — ED Notes (Signed)
This RN attempted to call report to 5N x3; no answer.
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
## 2022-01-22 NOTE — Transfer of Care (Signed)
Immediate Anesthesia Transfer of Care Note  Patient: Kristy Parsons  Procedure(s) Performed: OPEN REDUCTION INTERNAL FIXATION (ORIF) TIBIAL PLATEAU (Right)  Patient Location: PACU  Anesthesia Type:MAC and Spinal  Level of Consciousness: awake, patient cooperative, and responds to stimulation  Airway & Oxygen Therapy: Patient Spontanous Breathing and Patient connected to face mask oxygen  Post-op Assessment: Report given to RN and Post -op Vital signs reviewed and stable  Post vital signs: Reviewed and stable  Last Vitals:  Vitals Value Taken Time  BP 175/68 01/22/22 1157  Temp    Pulse 73 01/22/22 1200  Resp 20 01/22/22 1200  SpO2 100 % 01/22/22 1200  Vitals shown include unvalidated device data.  Last Pain:  Vitals:   01/22/22 0916  TempSrc:   PainSc: 0-No pain         Complications: No notable events documented.

## 2022-01-22 NOTE — Progress Notes (Signed)
Echo attempted multiple times, patient in OR and PACU for majority of the day. Echo will reattempt as schedule permits.  Kristy Parsons

## 2022-01-23 ENCOUNTER — Inpatient Hospital Stay (HOSPITAL_COMMUNITY): Payer: Medicare Other

## 2022-01-23 ENCOUNTER — Encounter (HOSPITAL_COMMUNITY): Payer: Self-pay | Admitting: Student

## 2022-01-23 DIAGNOSIS — I4891 Unspecified atrial fibrillation: Secondary | ICD-10-CM | POA: Diagnosis not present

## 2022-01-23 DIAGNOSIS — J9611 Chronic respiratory failure with hypoxia: Secondary | ICD-10-CM | POA: Diagnosis not present

## 2022-01-23 LAB — ECHOCARDIOGRAM COMPLETE
AR max vel: 1.5 cm2
AV Area VTI: 1.76 cm2
AV Area mean vel: 1.56 cm2
AV Mean grad: 17 mmHg
AV Peak grad: 27.5 mmHg
Ao pk vel: 2.62 m/s
Height: 65 in
S' Lateral: 3.5 cm
Weight: 2962.98 oz

## 2022-01-23 LAB — VITAMIN D 25 HYDROXY (VIT D DEFICIENCY, FRACTURES): Vit D, 25-Hydroxy: 23.29 ng/mL — ABNORMAL LOW (ref 30–100)

## 2022-01-23 LAB — BASIC METABOLIC PANEL
Anion gap: 9 (ref 5–15)
BUN: 14 mg/dL (ref 8–23)
CO2: 27 mmol/L (ref 22–32)
Calcium: 8.9 mg/dL (ref 8.9–10.3)
Chloride: 108 mmol/L (ref 98–111)
Creatinine, Ser: 0.85 mg/dL (ref 0.44–1.00)
GFR, Estimated: >60 mL/min (ref >60)
Glucose, Bld: 155 mg/dL — ABNORMAL HIGH (ref 70–99)
Potassium: 3.7 mmol/L (ref 3.5–5.1)
Sodium: 144 mmol/L (ref 135–145)

## 2022-01-23 LAB — CBC
HCT: 38.9 % (ref 36.0–46.0)
Hemoglobin: 12 g/dL (ref 12.0–15.0)
MCH: 27.8 pg (ref 26.0–34.0)
MCHC: 30.8 g/dL (ref 30.0–36.0)
MCV: 90.3 fL (ref 80.0–100.0)
Platelets: 150 10*3/uL (ref 150–400)
RBC: 4.31 MIL/uL (ref 3.87–5.11)
RDW: 15.3 % (ref 11.5–15.5)
WBC: 14.8 10*3/uL — ABNORMAL HIGH (ref 4.0–10.5)
nRBC: 0 % (ref 0.0–0.2)

## 2022-01-23 MED ORDER — PERFLUTREN LIPID MICROSPHERE
1.0000 mL | INTRAVENOUS | Status: AC | PRN
  Administered 2022-01-23: 4 mL via INTRAVENOUS

## 2022-01-23 MED ORDER — MUPIROCIN 2 % EX OINT
1.0000 | TOPICAL_OINTMENT | Freq: Two times a day (BID) | CUTANEOUS | Status: DC
  Administered 2022-01-23 – 2022-01-26 (×6): 1 via NASAL
  Filled 2022-01-23 (×2): qty 22

## 2022-01-23 MED ORDER — DILTIAZEM HCL 30 MG PO TABS: 30.0000 mg | ORAL_TABLET | Freq: Four times a day (QID) | ORAL | Status: DC

## 2022-01-23 MED ORDER — SODIUM CHLORIDE 0.9 % IV SOLN
100.0000 mg | Freq: Two times a day (BID) | INTRAVENOUS | Status: DC
  Administered 2022-01-24: 100 mg via INTRAVENOUS
  Filled 2022-01-23 (×4): qty 100

## 2022-01-23 MED ORDER — AMLODIPINE BESYLATE 10 MG PO TABS
10.0000 mg | ORAL_TABLET | Freq: Every day | ORAL | Status: DC
  Administered 2022-01-24 – 2022-01-26 (×3): 10 mg via ORAL
  Filled 2022-01-23 (×4): qty 1

## 2022-01-23 MED ORDER — CHLORHEXIDINE GLUCONATE CLOTH 2 % EX PADS
6.0000 | MEDICATED_PAD | Freq: Every day | CUTANEOUS | Status: DC
  Administered 2022-01-23 – 2022-01-26 (×4): 6 via TOPICAL

## 2022-01-23 MED ORDER — METOPROLOL TARTRATE 12.5 MG HALF TABLET
12.5000 mg | ORAL_TABLET | Freq: Two times a day (BID) | ORAL | Status: DC
  Administered 2022-01-23 – 2022-01-24 (×3): 12.5 mg via ORAL
  Filled 2022-01-23 (×3): qty 1

## 2022-01-23 MED ORDER — ASPIRIN 81 MG PO CHEW
81.0000 mg | CHEWABLE_TABLET | Freq: Every day | ORAL | Status: DC
  Administered 2022-01-23 – 2022-01-26 (×4): 81 mg via ORAL
  Filled 2022-01-23 (×4): qty 1
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
## 2022-01-23 NOTE — NC FL2 (Signed)
[place] LEVEL OF CARE FORM     IDENTIFICATION  Patient Name: Kristy Parsons Birthdate: 17-Dec-1933 Sex: female Admission Date (Current Location): 01/21/2022  [facility] and [place] Number:  Herbalist and Address:  [facility] [place], [place] [address] [place], [place] [address]      Provider Number: [id number]  Attending Physician Name and Address:  Hollice Gong, Mir [facility]*  Relative Name and Phone Number:  O'Brien,Carolyn Daughter   [phone]    Current Level of Care: Hospital Recommended Level of Care: [place] Prior Approval Number:    Date Approved/Denied:   PASRR Number: [id number]  Discharge Plan: SNF    Current Diagnoses: Patient Active Problem List   Diagnosis Date Noted   Tibial plateau fracture 01/22/2022   COPD (chronic obstructive pulmonary disease) ([place]) 01/22/2022   Chronic respiratory failure with hypoxia ([place]) 01/22/2022   Hypertensive urgency 01/22/2022   Hypokalemia 01/22/2022   Prolonged QT interval 01/22/2022   New onset atrial fibrillation ([place]) 01/22/2022   Sleep-related hypoventilation 07/09/2010   Complex sleep apnea syndrome 05/22/2010    Orientation RESPIRATION BLADDER Height & Weight     Self  O2 Continent, External catheter Weight: 185 lb 3 oz (84 kg) Height:  '5\' 5"'$  (165.1 cm)  BEHAVIORAL SYMPTOMS/MOOD NEUROLOGICAL BOWEL NUTRITION STATUS      Continent Diet (see discharge summary)  AMBULATORY STATUS COMMUNICATION OF NEEDS Skin   Total Care Verbally Surgical wounds                       Personal Care Assistance Level of Assistance  Bathing, Feeding, Dressing, Total care Bathing Assistance: Maximum assistance Feeding assistance: Limited assistance Dressing Assistance: Maximum assistance Total Care Assistance: Maximum assistance   Functional Limitations Info  Sight, Hearing, Speech Sight Info: Adequate Hearing Info: Adequate Speech Info: Adequate    SPECIAL CARE  FACTORS FREQUENCY  PT (By licensed PT), OT (By licensed OT)     PT Frequency: 5x week OT Frequency: 5x week            Contractures Contractures Info: Not present    Additional Factors Info  Code Status, Allergies Code Status Info: full Allergies Info: Bactrim, Nitrofurantoin Monohyd Macro, Sulfa Antibiotics, Azithromycin, Cortisone, Ketorolac           Current Medications (01/23/2022):  This is the current hospital active medication list Current Facility-Administered Medications  Medication Dose Route Frequency Provider Last Rate Last Admin   0.9 % NaCl with KCl 20 mEq/ L  infusion   Intravenous Continuous Corinne Ports, PA-C 50 mL/hr at 01/22/22 1511 New Bag at 01/22/22 1511   acetaminophen (TYLENOL) tablet 650 mg  650 mg Oral Q6H PRN Corinne Ports, PA-C       aspirin chewable tablet 81 mg  81 mg Oral Daily Hollice Gong, Mir Mohammed, MD   81 mg at 01/23/22 4401   Chlorhexidine Gluconate Cloth 2 % PADS 6 each  6 each Topical Q0600 Haddix, Thomasene Lot, MD   6 each at 01/23/22 1006   docusate sodium (COLACE) capsule 100 mg  100 mg Oral BID Rushie Nyhan A, PA-C   100 mg at 01/23/22 0955   enoxaparin (LOVENOX) injection 40 mg  40 mg Subcutaneous Q24H Rushie Nyhan A, PA-C   40 mg at 01/23/22 0954   fentaNYL (SUBLIMAZE) injection 25 mcg  25 mcg Intravenous Q2H PRN Corinne Ports, PA-C   25 mcg at 01/22/22 1518   fesoterodine (TOVIAZ)  tablet 4 mg  4 mg Oral Daily Corinne Ports, PA-C       fluticasone furoate-vilanterol (BREO ELLIPTA) 100-25 MCG/ACT 1 puff  1 puff Inhalation Daily Corinne Ports, PA-C   1 puff at 01/22/22 6333   HYDROcodone-acetaminophen (NORCO/VICODIN) 5-325 MG per tablet 1-2 tablet  1-2 tablet Oral Q4H PRN Corinne Ports, PA-C   2 tablet at 01/23/22 1402   labetalol (NORMODYNE) injection 10 mg  10 mg Intravenous Q2H PRN Corinne Ports, PA-C       metoCLOPramide (REGLAN) tablet 5-10 mg  5-10 mg Oral Q8H PRN Thereasa Solo, Sarah A, PA-C       Or    metoCLOPramide (REGLAN) injection 5-10 mg  5-10 mg Intravenous Q8H PRN Thereasa Solo, Sarah A, PA-C       metoprolol tartrate (LOPRESSOR) injection 5 mg  5 mg Intravenous Q2H PRN Corinne Ports, PA-C       metoprolol tartrate (LOPRESSOR) tablet 12.5 mg  12.5 mg Oral BID Hollice Gong, Mir Mohammed, MD   12.5 mg at 01/23/22 0955   mupirocin ointment (BACTROBAN) 2 % 1 Application  1 Application Nasal BID Haddix, Thomasene Lot, MD   1 Application at 54/56/25 0956   pantoprazole (PROTONIX) EC tablet 40 mg  40 mg Oral Daily Rushie Nyhan A, PA-C   40 mg at 01/23/22 6389   perflutren lipid microspheres (DEFINITY) IV suspension  1-10 mL Intravenous PRN Opyd, Ilene Qua, MD   4 mL at 01/23/22 1402   polyethylene glycol (MIRALAX / GLYCOLAX) packet 17 g  17 g Oral Daily PRN Rushie Nyhan A, PA-C       rosuvastatin (CRESTOR) tablet 5 mg  5 mg Oral Daily Corinne Ports, PA-C   5 mg at 01/23/22 3734     Discharge Medications: Please see discharge summary for a list of discharge medications.  Relevant Imaging Results:  Relevant Lab Results:   Additional Information SSN: [id number].  Joanne Chars, LCSW

## 2022-01-23 NOTE — Progress Notes (Signed)
Orthopaedic Trauma Progress Note  SUBJECTIVE: Doing ok this AM. Pain in the right leg but notes this is controlled with current medications. No chest pain. No SOB. No nausea/vomiting. No other complaints. Placed on metoprolol yesterday for a-fib rate control. Has not been out of bed since surgery.  No family at bedside currently.   OBJECTIVE:  Vitals:   01/22/22 2300 01/23/22 0740  BP: (!) 145/77 (!) 184/90  Pulse:  93  Resp:  17  Temp: 98.3 F (36.8 C) 100.1 F (37.8 C)  SpO2:  92%    General: Sitting up in bed, NAD Cardiac: Currently in A-fib, HR reading 90-160 on the monitor Respiratory: No increased work of breathing.  RLE: Dressing CDI. Tender about the knee as expected. Holds foot in plantarflexed position but does tolerate gentle ankle ROM. Endorses sensation throughout extremity. Toes warm and well perfused. 2+ DP pulse  IMAGING: Stable post op imaging.   LABS:  Results for orders placed or performed during the hospital encounter of 01/21/22 (from the past 24 hour(s))  Surgical pcr screen     Status: Abnormal   Collection Time: 01/22/22  9:13 AM   Specimen: Nasal Mucosa; Nasal Swab  Result Value Ref Range   MRSA, PCR NEGATIVE NEGATIVE   Staphylococcus aureus POSITIVE (A) NEGATIVE  CBC     Status: Abnormal   Collection Time: 01/23/22  4:27 AM  Result Value Ref Range   WBC 14.8 (H) 4.0 - 10.5 K/uL   RBC 4.31 3.87 - 5.11 MIL/uL   Hemoglobin 12.0 12.0 - 15.0 g/dL   HCT 38.9 36.0 - 46.0 %   MCV 90.3 80.0 - 100.0 fL   MCH 27.8 26.0 - 34.0 pg   MCHC 30.8 30.0 - 36.0 g/dL   RDW 15.3 11.5 - 15.5 %   Platelets 150 150 - 400 K/uL   nRBC 0.0 0.0 - 0.2 %  Basic metabolic panel     Status: Abnormal   Collection Time: 01/23/22  4:27 AM  Result Value Ref Range   Sodium 144 135 - 145 mmol/L   Potassium 3.7 3.5 - 5.1 mmol/L   Chloride 108 98 - 111 mmol/L   CO2 27 22 - 32 mmol/L   Glucose, Bld 155 (H) 70 - 99 mg/dL   BUN 14 8 - 23 mg/dL   Creatinine, Ser 0.85 0.44 - 1.00  mg/dL   Calcium 8.9 8.9 - 10.3 mg/dL   GFR, Estimated >60 >60 mL/min   Anion gap 9 5 - 15    ASSESSMENT: Kristy Parsons is a 88 y.o. female, 1 Day Post-Op s/p OPEN REDUCTION INTERNAL FIXATION RIGHT TIBIAL PLATEAU  CV/Blood loss: Acute blood loss anemia, Hgb 12.0 this AM.  PLAN: Weightbearing: NWB RLE ROM:  unrestricted knee ROM  Incisional and dressing care: Reinforce dressings as needed  Showering:  Ok to begin getting incisions wet 01/25/22 if no drainage Orthopedic device(s):  PRAFO boot ordered today for patient to wear while in bed to maintain dorsiflexion   Pain management:  1. Tylenol 650 mg q 6 hours PRN 2. Fentanyl 25 mcg q 2 hours PRN 3. Norco 5-325 mg q 4 hours PRN VTE prophylaxis: Lovenox, SCDs ID:  Ancef 2gm post op Foley/Lines:  No foley, KVO IVFs Impediments to Fracture Healing: Check baseline Vit D level. Will add supplementation as indicated Dispo: PT/OT eval today, will likely require SNF. Plan to remove dressing RLE tomorrow.   D/C recommendations: - Norco 5-325 mg for pain control - Eliquis 2.5 mg  BID x 30 days for DVT prophylaxis - Possible need for Vit D supplementation  Follow - up plan: 2 weeks after d/c for wound check and repeat x-rays   Contact information:  Katha Hamming MD, Rushie Nyhan PA-C. After hours and holidays please check [email or web address] for group call information for [facility]   Gwinda Passe, PA-C [phone] (office) [email or web address]

## 2022-01-23 NOTE — Anesthesia Postprocedure Evaluation (Signed)
Anesthesia Post Note  Patient: Kristy Parsons  Procedure(s) Performed: OPEN REDUCTION INTERNAL FIXATION (ORIF) TIBIAL PLATEAU (Right)     Patient location during evaluation: PACU Anesthesia Type: Spinal and MAC Level of consciousness: oriented and awake and alert Pain management: pain level controlled Vital Signs Assessment: post-procedure vital signs reviewed and stable Respiratory status: spontaneous breathing, respiratory function stable and patient connected to nasal cannula oxygen Cardiovascular status: blood pressure returned to baseline and stable Postop Assessment: no headache, no backache and no apparent nausea or vomiting Anesthetic complications: no   No notable events documented.  Last Vitals:  Vitals:   01/23/22 0740 01/23/22 1235  BP: (!) 184/90 (!) 181/67  Pulse: 93 76  Resp: 17 20  Temp: 37.8 C 36.7 C  SpO2: 92% 92%    Last Pain:  Vitals:   01/23/22 1235  TempSrc: Oral  PainSc:    Pain Goal: Patients Stated Pain Goal: 3 (01/22/22 2234)                 [place]

## 2022-01-23 NOTE — Progress Notes (Signed)
  Echocardiogram 2D Echocardiogram has been performed.  Kristy Parsons 01/23/2022, 2:16 PM

## 2022-01-23 NOTE — Progress Notes (Signed)
Orthopedic Tech Progress Note Patient Details:  Kristy Parsons [birth year] [id number]  Prafo boot applied to RLE without complication.  Ortho Devices Type of Ortho Device: Prafo boot/shoe Ortho Device/Splint Location: RLE Ortho Device/Splint Interventions: Ordered, Application, Adjustment   Post Interventions Patient Tolerated: Well Instructions Provided: Care of device, Adjustment of device (instructions also explained to daughter at bedside)  Carin Primrose 01/23/2022, 3:21 PM

## 2022-01-24 DIAGNOSIS — J189 Pneumonia, unspecified organism: Secondary | ICD-10-CM | POA: Diagnosis not present

## 2022-01-24 LAB — BASIC METABOLIC PANEL
Anion gap: 10 (ref 5–15)
BUN: 16 mg/dL (ref 8–23)
CO2: 25 mmol/L (ref 22–32)
Calcium: 9 mg/dL (ref 8.9–10.3)
Chloride: 107 mmol/L (ref 98–111)
Creatinine, Ser: 0.85 mg/dL (ref 0.44–1.00)
GFR, Estimated: >60 mL/min (ref >60)
Glucose, Bld: 161 mg/dL — ABNORMAL HIGH (ref 70–99)
Potassium: 3.9 mmol/L (ref 3.5–5.1)
Sodium: 142 mmol/L (ref 135–145)

## 2022-01-24 LAB — CBC
HCT: 38.1 % (ref 36.0–46.0)
Hemoglobin: 12.2 g/dL (ref 12.0–15.0)
MCH: 28.5 pg (ref 26.0–34.0)
MCHC: 32 g/dL (ref 30.0–36.0)
MCV: 89 fL (ref 80.0–100.0)
Platelets: 146 10*3/uL — ABNORMAL LOW (ref 150–400)
RBC: 4.28 MIL/uL (ref 3.87–5.11)
RDW: 15 % (ref 11.5–15.5)
WBC: 14.5 10*3/uL — ABNORMAL HIGH (ref 4.0–10.5)
nRBC: 0 % (ref 0.0–0.2)

## 2022-01-24 MED ORDER — APIXABAN 2.5 MG PO TABS: 2.5000 mg | ORAL_TABLET | Freq: Two times a day (BID) | ORAL | 0 refills | Status: DC

## 2022-01-24 MED ORDER — VITAMIN D 25 MCG (1000 UNIT) PO TABS
2000.0000 [IU] | ORAL_TABLET | Freq: Every day | ORAL | Status: DC
  Administered 2022-01-24 – 2022-01-26 (×3): 2000 [IU] via ORAL
  Filled 2022-01-24 (×3): qty 2

## 2022-01-24 MED ORDER — VITAMIN D3 50 MCG (2000 UT) PO CAPS: 2000.0000 [IU] | ORAL_CAPSULE | Freq: Every day | ORAL | 2 refills | Status: AC

## 2022-01-24 MED ORDER — METOPROLOL TARTRATE 25 MG PO TABS
25.0000 mg | ORAL_TABLET | Freq: Two times a day (BID) | ORAL | Status: DC
  Administered 2022-01-24 – 2022-01-26 (×4): 25 mg via ORAL
  Filled 2022-01-24 (×4): qty 1

## 2022-01-24 MED ORDER — DOXYCYCLINE HYCLATE 100 MG PO TABS
100.0000 mg | ORAL_TABLET | Freq: Two times a day (BID) | ORAL | Status: DC
  Administered 2022-01-24 – 2022-01-26 (×5): 100 mg via ORAL
  Filled 2022-01-24 (×5): qty 1

## 2022-01-24 MED ORDER — HYDROCODONE-ACETAMINOPHEN 5-325 MG PO TABS: 1.0000 | ORAL_TABLET | ORAL | 0 refills | Status: DC | PRN

## 2022-01-24 MED ORDER — METOPROLOL TARTRATE 12.5 MG HALF TABLET
12.5000 mg | ORAL_TABLET | Freq: Once | ORAL | Status: AC
  Administered 2022-01-24: 12.5 mg via ORAL
  Filled 2022-01-24: qty 1
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
## 2022-01-24 NOTE — Care Management Important Message (Signed)
Important Message  Patient Details  Name: Kristy Parsons MRN: [id number] Date of Birth: [birth year]   Medicare Important Message Given:  Yes     Hannah Beat 01/24/2022, 12:46 PM

## 2022-01-24 NOTE — TOC CAGE-AID Note (Signed)
Transition of Care [facility]) - CAGE-AID Screening   Patient Details  Name: Kristy Parsons MRN: [id number] Date of Birth: Nov 22, 1933  Transition [facility]) CM/SW Contact:    Army Melia, RN Phone Number:[phone] 01/24/2022, 12:37 AM   Clinical Narrative:  No drug/alcohol use hx, no resources indicated.  CAGE-AID Screening:    Have You Ever Felt You Ought to Cut Down on Your Drinking or Drug Use?: No Have People Annoyed You By Critizing Your Drinking Or Drug Use?: No Have You Felt Bad Or Guilty About Your Drinking Or Drug Use?: No Have You Ever Had a Drink or Used Drugs First Thing In The Morning to Steady Your Nerves or to Get Rid of a Hangover?: No CAGE-AID Score: 0  Substance Abuse Education Offered: No

## 2022-01-24 NOTE — TOC Progression Note (Signed)
Transition of Care [facility]) - Progression Note    Patient Details  Name: Kristy Parsons MRN: [id number] Date of Birth: 11/11/1933  Transition of Care [facility]) CM/SW [place], [place] Phone Number: 01/24/2022, 4:10 PM  Clinical Narrative:     CSW spoke with pt's daughter who states she spoke with [facility] and they can admit pt tomorrow. CSW confirmed with [facility].    Pt can DC to [facility] SN[facility] tomorrow; going to room 114. RN to call report to [phone]. PTAR can be scheduled once DC summary and orders are complete. SNF will need printed and physically signed prescriptions for any controlled medications to go with her at DC.     Barriers to Discharge: Continued Medical Work up, SNF Pending bed offer  Expected Discharge Plan and Services In-house Referral: Clinical Social Work   Post Acute Care Choice: [place] Living arrangements for the past 2 months: [place] Determinants of Health (SDOH) Interventions [place]: No Food Insecurity (01/22/2022)  Housing: Low Risk  (01/22/2022)  Transportation Needs: No Transportation Needs (01/22/2022)  Utilities: Not At Risk (01/22/2022)  Tobacco Use: Medium Risk (01/23/2022)    Readmission Risk Interventions     No data to display

## 2022-01-24 NOTE — Progress Notes (Signed)
Orthopaedic Trauma Progress Note  SUBJECTIVE: Doing ok this AM. Pain in the right leg but notes this is controlled with current medications. No chest pain. No SOB. No nausea/vomiting. No other complaints.  No family at bedside currently.   OBJECTIVE:  Vitals:   01/23/22 1235 01/24/22 0112  BP: (!) 181/67 (!) 155/105  Pulse: 76 (!) 102  Resp: 20   Temp: 98.1 F (36.7 C) 98.2 F (36.8 C)  SpO2: 92%     General: Sitting up in bed, NAD. Alert and oriented x1 Respiratory: No increased work of breathing.  RLE: PRAFO boot in place. Dressing removed, incisions CDI. Tender about the knee as expected. Able to wiggle toes. Endorses sensation throughout extremity. Toes warm and well perfused. 2+ DP pulse  IMAGING: Stable post op imaging.   LABS:  Results for orders placed or performed during the hospital encounter of 01/21/22 (from the past 24 hour(s))  VITAMIN D 25 Hydroxy (Vit-D Deficiency, Fractures)     Status: Abnormal   Collection Time: 01/23/22  9:59 AM  Result Value Ref Range   Vit D, 25-Hydroxy 23.29 (L) 30 - 100 ng/mL  CBC     Status: Abnormal   Collection Time: 01/24/22  3:28 AM  Result Value Ref Range   WBC 14.5 (H) 4.0 - 10.5 K/uL   RBC 4.28 3.87 - 5.11 MIL/uL   Hemoglobin 12.2 12.0 - 15.0 g/dL   HCT 38.1 36.0 - 46.0 %   MCV 89.0 80.0 - 100.0 fL   MCH 28.5 26.0 - 34.0 pg   MCHC 32.0 30.0 - 36.0 g/dL   RDW 15.0 11.5 - 15.5 %   Platelets 146 (L) 150 - 400 K/uL   nRBC 0.0 0.0 - 0.2 %  Basic metabolic panel     Status: Abnormal   Collection Time: 01/24/22  3:28 AM  Result Value Ref Range   Sodium 142 135 - 145 mmol/L   Potassium 3.9 3.5 - 5.1 mmol/L   Chloride 107 98 - 111 mmol/L   CO2 25 22 - 32 mmol/L   Glucose, Bld 161 (H) 70 - 99 mg/dL   BUN 16 8 - 23 mg/dL   Creatinine, Ser 0.85 0.44 - 1.00 mg/dL   Calcium 9.0 8.9 - 10.3 mg/dL   GFR, Estimated >60 >60 mL/min   Anion gap 10 5 - 15    ASSESSMENT: Kristy Parsons is a 88 y.o. female, 2 Days Post-Op s/p OPEN  REDUCTION INTERNAL FIXATION RIGHT TIBIAL PLATEAU  CV/Blood loss: Acute blood loss anemia, Hgb 12.2 this AM.  PLAN: Weightbearing: NWB RLE ROM:  unrestricted knee ROM  Incisional and dressing care: Ok to leave incisions open to air Showering:  Ok to begin getting incisions wet 01/25/22 if no drainage Orthopedic device(s):  PRAFO boot to maintain dorsiflexion   Pain management:  1. Tylenol 650 mg q 6 hours PRN 2. Fentanyl 25 mcg q 2 hours PRN 3. Norco 5-325 mg q 4 hours PRN VTE prophylaxis: Lovenox, SCDs ID:  Ancef 2gm post op completed Foley/Lines:  No foley, KVO IVFs Impediments to Fracture Healing: Vit D level 23, add supplementation  Dispo: PT/OT recommending SNF. Ok for d/c from ortho standpoint once cleared by medicine team and therapies  D/C recommendations: I have signed and placed rx in chart - Norco 5-325 mg for pain control - Eliquis 2.5 mg BID x 30 days for DVT prophylaxis - Continue 2000 units Vit D supplementation daily x 90 days  Follow - up plan: 2 weeks  after d/c for wound check and repeat x-rays   Contact information:  Katha Hamming MD, Rushie Nyhan PA-C. After hours and holidays please check [email or web address] for group call information for [facility]   Gwinda Passe, PA-C [phone] (office) [email or web address]

## 2022-01-25 DIAGNOSIS — J189 Pneumonia, unspecified organism: Secondary | ICD-10-CM | POA: Diagnosis not present

## 2022-01-25 LAB — MAGNESIUM: Magnesium: 2 mg/dL (ref 1.7–2.4)

## 2022-01-25 LAB — BASIC METABOLIC PANEL
Anion gap: 9 (ref 5–15)
BUN: 23 mg/dL (ref 8–23)
CO2: 23 mmol/L (ref 22–32)
Calcium: 8.8 mg/dL — ABNORMAL LOW (ref 8.9–10.3)
Chloride: 107 mmol/L (ref 98–111)
Creatinine, Ser: 0.93 mg/dL (ref 0.44–1.00)
GFR, Estimated: 60 mL/min — ABNORMAL LOW (ref >60)
Glucose, Bld: 195 mg/dL — ABNORMAL HIGH (ref 70–99)
Potassium: 4.1 mmol/L (ref 3.5–5.1)
Sodium: 139 mmol/L (ref 135–145)

## 2022-01-25 LAB — GLUCOSE, CAPILLARY: Glucose-Capillary: 188 mg/dL — ABNORMAL HIGH (ref 70–99)

## 2022-01-25 MED ORDER — DOXYCYCLINE HYCLATE 100 MG PO TABS: 100.0000 mg | ORAL_TABLET | Freq: Two times a day (BID) | ORAL | 0 refills | Status: AC

## 2022-01-25 MED ORDER — DILTIAZEM HCL 30 MG PO TABS
30.0000 mg | ORAL_TABLET | Freq: Four times a day (QID) | ORAL | Status: DC
  Administered 2022-01-25 – 2022-01-26 (×5): 30 mg via ORAL
  Filled 2022-01-25 (×5): qty 1
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
## 2022-01-25 NOTE — Significant Event (Signed)
Rapid Response Event Note   Reason for Call :  Tachycardia-130  Per RN, pt has been having episodes of HR-130s but during this episode pt is cool/clammy and 2nd set of eyes are requested.   Initial Focused Assessment:  Pt lying in bed with eyes open, in no visible distress. Pt denies CP/SOB/dizziness. Lungs diminished t/o. Skin cool/clammy.  HR-105, BP-119/70, RR-20, SpO2-93% on 2L [place].  Interventions:  EKG-ST CBG-188 BMP Mg Plan of Care:  Await lab values. Continue to monitor pt. Call RRT if further assistance needed,.  Event Summary:   MD Notified: Zebedee Iba, NP Call [place] [phone]  Dillard Essex, RN

## 2022-01-25 NOTE — Progress Notes (Signed)
Pt refused CPAP at this time RN made aware. Pt is currently tolerating nasal cannula with SpO2 of 96% at this time. RN notified to call RT if any changes.

## 2022-01-25 NOTE — TOC Progression Note (Signed)
Transition of Care [facility]) - Progression Note    Patient Details  Name: Kristy Parsons MRN: [id number] Date of Birth: 11/28/1933  Transition of Care [facility]) CM/SW [place], [place] Phone Number: 01/25/2022, 9:12 AM  Clinical Narrative:      Per MD, pt had episode of tachycardia today, adjusting meds.   SW spoke with [facility] Claudina Lick [phone]) able to still accept tomorrow 12/24 but will need d/c summary by 11am. Unable to accept on Monday 12/25.    Barriers to Discharge: Continued Medical Work up, SNF Pending bed offer  Expected Discharge Plan and Services In-house Referral: Clinical Social Work   Post Acute Care Choice: [place] Living arrangements for the past 2 months: [place] Determinants of Health (SDOH) Interventions [place]: No Food Insecurity (01/22/2022)  Housing: Low Risk  (01/22/2022)  Transportation Needs: No Transportation Needs (01/22/2022)  Utilities: Not At Risk (01/22/2022)  Tobacco Use: Medium Risk (01/23/2022)    Readmission Risk Interventions     No data to display

## 2022-01-25 NOTE — Progress Notes (Signed)
Received call from [place], patient had a change in T-wave and heart rate has been sustaining in the 130's to 140's.  Gave PRN Lopressor 5 mg inj.  Patient is alert and oriented x 3 disoriented to time.  Because of heart rate and RR patient is in red mews.  Will contact on call MD to notify.  0200 On call MD ordered labs STAT and EKG.  0721 Rapid Response on the way to unit.

## 2022-01-26 ENCOUNTER — Other Ambulatory Visit (HOSPITAL_BASED_OUTPATIENT_CLINIC_OR_DEPARTMENT_OTHER): Payer: Self-pay | Admitting: Osteopathic Medicine

## 2022-01-26 DIAGNOSIS — J9611 Chronic respiratory failure with hypoxia: Secondary | ICD-10-CM | POA: Diagnosis not present

## 2022-01-26 DIAGNOSIS — G4731 Primary central sleep apnea: Secondary | ICD-10-CM | POA: Diagnosis not present

## 2022-01-26 DIAGNOSIS — J449 Chronic obstructive pulmonary disease, unspecified: Secondary | ICD-10-CM | POA: Diagnosis not present

## 2022-01-26 DIAGNOSIS — S82141A Displaced bicondylar fracture of right tibia, initial encounter for closed fracture: Secondary | ICD-10-CM | POA: Diagnosis not present

## 2022-01-26 MED ORDER — METOPROLOL TARTRATE 25 MG PO TABS: 25.0000 mg | ORAL_TABLET | Freq: Two times a day (BID) | ORAL | 0 refills | Status: DC

## 2022-01-26 NOTE — Progress Notes (Signed)
Patient discharged to 1230. BP 110/73, temp 98.5, HR 84, O2 89% room air. All discharge instructions explained to patient and patients daughter who was present at bedside. [facility] to receive patient. PTAR called to transport patient. Patient escorted to facility accompanied by daughter via PTAR. AVS Summary and med necessity printed and given to PTAR.

## 2022-01-26 NOTE — Progress Notes (Signed)
CSW spoke with Lynelle Smoke and Mikki Santee at [facility] who confirm they are expecting this patient to arrive today.  Patient can be transferred to [facility] via [place]. The number to call for report is [phone]. RN to call report and PTAR when ready.  Madilyn Fireman, MSW, LCSW Transitions of Care  Clinical Social Worker II [phone]

## 2022-01-26 NOTE — Plan of Care (Signed)
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
## 2022-01-26 NOTE — Progress Notes (Signed)
Report called and given to Mikki Santee at [facility].

## 2022-01-27 ENCOUNTER — Emergency Department (HOSPITAL_COMMUNITY): Payer: Medicare Other

## 2022-01-27 ENCOUNTER — Other Ambulatory Visit: Payer: Self-pay

## 2022-01-27 ENCOUNTER — Inpatient Hospital Stay (HOSPITAL_COMMUNITY)
Admission: EM | Admit: 2022-01-27 | Discharge: 2022-01-29 | DRG: 175 | Disposition: A | Discharge status: Skilled Nursing Facility | Payer: Medicare Other | Source: Other Acute Inpatient Hospital | Attending: Internal Medicine | Admitting: Internal Medicine

## 2022-01-27 DIAGNOSIS — J449 Chronic obstructive pulmonary disease, unspecified: Secondary | ICD-10-CM | POA: Diagnosis present

## 2022-01-27 DIAGNOSIS — I480 Paroxysmal atrial fibrillation: Secondary | ICD-10-CM | POA: Diagnosis present

## 2022-01-27 DIAGNOSIS — R9431 Abnormal electrocardiogram [ECG] [EKG]: Secondary | ICD-10-CM | POA: Diagnosis not present

## 2022-01-27 DIAGNOSIS — R7989 Other specified abnormal findings of blood chemistry: Secondary | ICD-10-CM | POA: Insufficient documentation

## 2022-01-27 DIAGNOSIS — I2609 Other pulmonary embolism with acute cor pulmonale: Principal | ICD-10-CM | POA: Diagnosis present

## 2022-01-27 DIAGNOSIS — Z888 Allergy status to other drugs, medicaments and biological substances status: Secondary | ICD-10-CM

## 2022-01-27 DIAGNOSIS — I2489 Other forms of acute ischemic heart disease: Secondary | ICD-10-CM | POA: Diagnosis not present

## 2022-01-27 DIAGNOSIS — K769 Liver disease, unspecified: Secondary | ICD-10-CM | POA: Diagnosis not present

## 2022-01-27 DIAGNOSIS — S82141D Displaced bicondylar fracture of right tibia, subsequent encounter for closed fracture with routine healing: Secondary | ICD-10-CM

## 2022-01-27 DIAGNOSIS — Z7901 Long term (current) use of anticoagulants: Secondary | ICD-10-CM | POA: Diagnosis not present

## 2022-01-27 DIAGNOSIS — S82143A Displaced bicondylar fracture of unspecified tibia, initial encounter for closed fracture: Secondary | ICD-10-CM | POA: Diagnosis present

## 2022-01-27 DIAGNOSIS — Z886 Allergy status to analgesic agent status: Secondary | ICD-10-CM | POA: Diagnosis not present

## 2022-01-27 DIAGNOSIS — S2220XD Unspecified fracture of sternum, subsequent encounter for fracture with routine healing: Secondary | ICD-10-CM

## 2022-01-27 DIAGNOSIS — G4733 Obstructive sleep apnea (adult) (pediatric): Secondary | ICD-10-CM | POA: Diagnosis present

## 2022-01-27 DIAGNOSIS — Z853 Personal history of malignant neoplasm of breast: Secondary | ICD-10-CM

## 2022-01-27 DIAGNOSIS — Z882 Allergy status to sulfonamides status: Secondary | ICD-10-CM | POA: Diagnosis not present

## 2022-01-27 DIAGNOSIS — R000 Tachycardia, unspecified: Secondary | ICD-10-CM | POA: Diagnosis not present

## 2022-01-27 DIAGNOSIS — I959 Hypotension, unspecified: Secondary | ICD-10-CM | POA: Diagnosis present

## 2022-01-27 DIAGNOSIS — Z1152 Encounter for screening for COVID-19: Secondary | ICD-10-CM | POA: Diagnosis not present

## 2022-01-27 DIAGNOSIS — Z66 Do not resuscitate: Secondary | ICD-10-CM | POA: Diagnosis present

## 2022-01-27 DIAGNOSIS — R6510 Systemic inflammatory response syndrome (SIRS) of non-infectious origin without acute organ dysfunction: Secondary | ICD-10-CM | POA: Diagnosis present

## 2022-01-27 DIAGNOSIS — J4489 Other specified chronic obstructive pulmonary disease: Secondary | ICD-10-CM | POA: Diagnosis present

## 2022-01-27 DIAGNOSIS — E7800 Pure hypercholesterolemia, unspecified: Secondary | ICD-10-CM | POA: Diagnosis present

## 2022-01-27 DIAGNOSIS — T8149XA Infection following a procedure, other surgical site, initial encounter: Secondary | ICD-10-CM

## 2022-01-27 DIAGNOSIS — G4731 Primary central sleep apnea: Secondary | ICD-10-CM | POA: Diagnosis not present

## 2022-01-27 DIAGNOSIS — I2699 Other pulmonary embolism without acute cor pulmonale: Secondary | ICD-10-CM | POA: Diagnosis not present

## 2022-01-27 DIAGNOSIS — Z791 Long term (current) use of non-steroidal anti-inflammatories (NSAID): Secondary | ICD-10-CM | POA: Diagnosis not present

## 2022-01-27 DIAGNOSIS — S2220XA Unspecified fracture of sternum, initial encounter for closed fracture: Secondary | ICD-10-CM

## 2022-01-27 DIAGNOSIS — Z7951 Long term (current) use of inhaled steroids: Secondary | ICD-10-CM | POA: Diagnosis not present

## 2022-01-27 DIAGNOSIS — L03115 Cellulitis of right lower limb: Secondary | ICD-10-CM | POA: Diagnosis present

## 2022-01-27 DIAGNOSIS — I2694 Multiple subsegmental pulmonary emboli without acute cor pulmonale: Secondary | ICD-10-CM

## 2022-01-27 DIAGNOSIS — J9589 Other postprocedural complications and disorders of respiratory system, not elsewhere classified: Secondary | ICD-10-CM | POA: Diagnosis present

## 2022-01-27 DIAGNOSIS — J9611 Chronic respiratory failure with hypoxia: Secondary | ICD-10-CM | POA: Diagnosis present

## 2022-01-27 DIAGNOSIS — I10 Essential (primary) hypertension: Secondary | ICD-10-CM | POA: Diagnosis present

## 2022-01-27 DIAGNOSIS — Z79899 Other long term (current) drug therapy: Secondary | ICD-10-CM | POA: Diagnosis not present

## 2022-01-27 DIAGNOSIS — S2242XD Multiple fractures of ribs, left side, subsequent encounter for fracture with routine healing: Secondary | ICD-10-CM

## 2022-01-27 DIAGNOSIS — G4739 Other sleep apnea: Secondary | ICD-10-CM

## 2022-01-27 DIAGNOSIS — R748 Abnormal levels of other serum enzymes: Secondary | ICD-10-CM | POA: Insufficient documentation

## 2022-01-27 DIAGNOSIS — Z881 Allergy status to other antibiotic agents status: Secondary | ICD-10-CM

## 2022-01-27 LAB — COMPREHENSIVE METABOLIC PANEL
ALT: 72 U/L — ABNORMAL HIGH (ref 0–44)
AST: 92 U/L — ABNORMAL HIGH (ref 15–41)
Albumin: 2.2 g/dL — ABNORMAL LOW (ref 3.5–5.0)
Alkaline Phosphatase: 88 U/L (ref 38–126)
Anion gap: 10 (ref 5–15)
BUN: 29 mg/dL — ABNORMAL HIGH (ref 8–23)
CO2: 20 mmol/L — ABNORMAL LOW (ref 22–32)
Calcium: 8.3 mg/dL — ABNORMAL LOW (ref 8.9–10.3)
Chloride: 108 mmol/L (ref 98–111)
Creatinine, Ser: 1 mg/dL (ref 0.44–1.00)
GFR, Estimated: 55 mL/min — ABNORMAL LOW (ref >60)
Glucose, Bld: 151 mg/dL — ABNORMAL HIGH (ref 70–99)
Potassium: 4.9 mmol/L (ref 3.5–5.1)
Sodium: 138 mmol/L (ref 135–145)
Total Bilirubin: 1.6 mg/dL — ABNORMAL HIGH (ref 0.3–1.2)
Total Protein: 4.9 g/dL — ABNORMAL LOW (ref 6.5–8.1)

## 2022-01-27 LAB — RESP PANEL BY RT-PCR (RSV, FLU A&B, COVID)  RVPGX2
Influenza A by PCR: NEGATIVE
Influenza B by PCR: NEGATIVE
Resp Syncytial Virus by PCR: NEGATIVE
SARS Coronavirus 2 by RT PCR: NEGATIVE

## 2022-01-27 LAB — CBC WITH DIFFERENTIAL/PLATELET
Abs Immature Granulocytes: 0.2 10*3/uL — ABNORMAL HIGH (ref 0.00–0.07)
Basophils Absolute: 0.1 10*3/uL (ref 0.0–0.1)
Basophils Relative: 1 %
Eosinophils Absolute: 0.1 10*3/uL (ref 0.0–0.5)
Eosinophils Relative: 1 %
HCT: 35.7 % — ABNORMAL LOW (ref 36.0–46.0)
Hemoglobin: 11.1 g/dL — ABNORMAL LOW (ref 12.0–15.0)
Immature Granulocytes: 1 %
Lymphocytes Relative: 9 %
Lymphs Abs: 1.5 10*3/uL (ref 0.7–4.0)
MCH: 28.6 pg (ref 26.0–34.0)
MCHC: 31.1 g/dL (ref 30.0–36.0)
MCV: 92 fL (ref 80.0–100.0)
Monocytes Absolute: 2 10*3/uL — ABNORMAL HIGH (ref 0.1–1.0)
Monocytes Relative: 12 %
Neutro Abs: 12.5 10*3/uL — ABNORMAL HIGH (ref 1.7–7.7)
Neutrophils Relative %: 76 %
Platelets: 273 10*3/uL (ref 150–400)
RBC: 3.88 MIL/uL (ref 3.87–5.11)
RDW: 15.4 % (ref 11.5–15.5)
WBC: 16.4 10*3/uL — ABNORMAL HIGH (ref 4.0–10.5)
nRBC: 0.1 % (ref 0.0–0.2)

## 2022-01-27 LAB — URINALYSIS, ROUTINE W REFLEX MICROSCOPIC
Glucose, UA: NEGATIVE mg/dL
Ketones, ur: NEGATIVE mg/dL
Nitrite: NEGATIVE
Protein, ur: 30 mg/dL — AB
Specific Gravity, Urine: 1.015 (ref 1.005–1.030)
pH: 5.5 (ref 5.0–8.0)

## 2022-01-27 LAB — LACTIC ACID, PLASMA
Lactic Acid, Venous: 2.2 mmol/L (ref 0.5–1.9)
Lactic Acid, Venous: 2.6 mmol/L (ref 0.5–1.9)

## 2022-01-27 LAB — URINALYSIS, MICROSCOPIC (REFLEX)

## 2022-01-27 LAB — PROTIME-INR
INR: 1.2 (ref 0.8–1.2)
Prothrombin Time: 15.4 seconds — ABNORMAL HIGH (ref 11.4–15.2)

## 2022-01-27 LAB — MAGNESIUM: Magnesium: 2.3 mg/dL (ref 1.7–2.4)

## 2022-01-27 LAB — TROPONIN I (HIGH SENSITIVITY): Troponin I (High Sensitivity): 112 ng/L (ref <18)

## 2022-01-27 MED ORDER — BISACODYL 5 MG PO TBEC: 5.0000 mg | DELAYED_RELEASE_TABLET | Freq: Every day | ORAL | Status: DC | PRN

## 2022-01-27 MED ORDER — HYDROCORTISONE (PERIANAL) 2.5 % EX CREA: 1.0000 | TOPICAL_CREAM | Freq: Every day | CUTANEOUS | Status: DC | PRN

## 2022-01-27 MED ORDER — VANCOMYCIN HCL IN DEXTROSE 1-5 GM/200ML-% IV SOLN: 1000.0000 mg | Freq: Once | INTRAVENOUS | Status: DC

## 2022-01-27 MED ORDER — IOHEXOL 350 MG/ML SOLN
75.0000 mL | Freq: Once | INTRAVENOUS | Status: AC | PRN
  Administered 2022-01-27: 75 mL via INTRAVENOUS

## 2022-01-27 MED ORDER — ONDANSETRON HCL 4 MG/2ML IJ SOLN: 4.0000 mg | Freq: Four times a day (QID) | INTRAMUSCULAR | Status: DC | PRN

## 2022-01-27 MED ORDER — PANTOPRAZOLE SODIUM 40 MG PO TBEC
40.0000 mg | DELAYED_RELEASE_TABLET | Freq: Every day | ORAL | Status: DC
  Administered 2022-01-28 – 2022-01-29 (×2): 40 mg via ORAL
  Filled 2022-01-27: qty 1

## 2022-01-27 MED ORDER — LACTATED RINGERS IV BOLUS
1000.0000 mL | Freq: Once | INTRAVENOUS | Status: AC
  Administered 2022-01-27: 1000 mL via INTRAVENOUS

## 2022-01-27 MED ORDER — ONDANSETRON HCL 4 MG/2ML IJ SOLN
4.0000 mg | Freq: Once | INTRAMUSCULAR | Status: AC
  Administered 2022-01-27: 4 mg via INTRAVENOUS
  Filled 2022-01-27: qty 2

## 2022-01-27 MED ORDER — HYDROCODONE-ACETAMINOPHEN 5-325 MG PO TABS
1.0000 | ORAL_TABLET | ORAL | Status: DC | PRN
  Administered 2022-01-27: 1 via ORAL
  Filled 2022-01-27: qty 1

## 2022-01-27 MED ORDER — METOPROLOL TARTRATE 25 MG PO TABS: 25.0000 mg | ORAL_TABLET | Freq: Two times a day (BID) | ORAL | Status: DC

## 2022-01-27 MED ORDER — SODIUM CHLORIDE 0.9 % IV SOLN: INTRAVENOUS | Status: DC

## 2022-01-27 MED ORDER — ONDANSETRON HCL 4 MG PO TABS: 4.0000 mg | ORAL_TABLET | Freq: Four times a day (QID) | ORAL | Status: DC | PRN

## 2022-01-27 MED ORDER — MELOXICAM 7.5 MG PO TABS
7.5000 mg | ORAL_TABLET | Freq: Every day | ORAL | Status: DC
  Administered 2022-01-29: 7.5 mg via ORAL
  Filled 2022-01-27 (×3): qty 1

## 2022-01-27 MED ORDER — SODIUM CHLORIDE 0.9 % IV SOLN
2.0000 g | Freq: Once | INTRAVENOUS | Status: DC
  Filled 2022-01-27: qty 12.5

## 2022-01-27 MED ORDER — CLOBETASOL PROPIONATE 0.05 % EX CREA: 1.0000 | TOPICAL_CREAM | Freq: Two times a day (BID) | CUTANEOUS | Status: DC | PRN

## 2022-01-27 MED ORDER — VITAMIN D 25 MCG (1000 UNIT) PO TABS
2000.0000 [IU] | ORAL_TABLET | Freq: Every day | ORAL | Status: DC
  Administered 2022-01-29: 2000 [IU] via ORAL
  Filled 2022-01-27: qty 2

## 2022-01-27 MED ORDER — VENLAFAXINE HCL ER 75 MG PO CP24
75.0000 mg | ORAL_CAPSULE | Freq: Every day | ORAL | Status: DC
  Administered 2022-01-28 – 2022-01-29 (×2): 75 mg via ORAL
  Filled 2022-01-27: qty 1

## 2022-01-27 MED ORDER — ACETAMINOPHEN 500 MG PO TABS: 500.0000 mg | ORAL_TABLET | Freq: Four times a day (QID) | ORAL | Status: DC | PRN

## 2022-01-27 MED ORDER — POLYVINYL ALCOHOL 1.4 % OP SOLN: 1.0000 [drp] | OPHTHALMIC | Status: DC | PRN

## 2022-01-27 MED ORDER — FLUTICASONE FUROATE-VILANTEROL 100-25 MCG/ACT IN AEPB
1.0000 | INHALATION_SPRAY | Freq: Every day | RESPIRATORY_TRACT | Status: DC
  Filled 2022-01-27: qty 28

## 2022-01-27 MED ORDER — ROSUVASTATIN CALCIUM 5 MG PO TABS
5.0000 mg | ORAL_TABLET | Freq: Every day | ORAL | Status: DC
  Administered 2022-01-27 – 2022-01-28 (×2): 5 mg via ORAL
  Filled 2022-01-27 (×2): qty 1

## 2022-01-27 MED ORDER — SODIUM CHLORIDE 0.9 % IV SOLN: 2.0000 g | Freq: Two times a day (BID) | INTRAVENOUS | Status: DC

## 2022-01-27 MED ORDER — HEPARIN BOLUS VIA INFUSION
3500.0000 [IU] | Freq: Once | INTRAVENOUS | Status: AC
  Administered 2022-01-27: 3500 [IU] via INTRAVENOUS
  Filled 2022-01-27: qty 3500

## 2022-01-27 MED ORDER — CALCIUM CARBONATE 1250 (500 CA) MG PO TABS
1250.0000 mg | ORAL_TABLET | Freq: Every day | ORAL | Status: DC
  Administered 2022-01-28 – 2022-01-29 (×2): 1250 mg via ORAL
  Filled 2022-01-27: qty 1

## 2022-01-27 MED ORDER — DILTIAZEM HCL 60 MG PO TABS
60.0000 mg | ORAL_TABLET | Freq: Three times a day (TID) | ORAL | Status: DC
  Administered 2022-01-28 (×3): 60 mg via ORAL
  Filled 2022-01-27 (×5): qty 1

## 2022-01-27 MED ORDER — ADENOSINE 6 MG/2ML IV SOLN
6.0000 mg | Freq: Once | INTRAVENOUS | Status: DC
  Filled 2022-01-27: qty 2

## 2022-01-27 MED ORDER — PIPERACILLIN-TAZOBACTAM 3.375 G IVPB
3.3750 g | Freq: Three times a day (TID) | INTRAVENOUS | Status: DC
  Administered 2022-01-27 – 2022-01-29 (×5): 3.375 g via INTRAVENOUS
  Filled 2022-01-27 (×5): qty 50

## 2022-01-27 MED ORDER — VANCOMYCIN HCL 750 MG/150ML IV SOLN
750.0000 mg | INTRAVENOUS | Status: DC
  Administered 2022-01-28: 750 mg via INTRAVENOUS
  Filled 2022-01-27: qty 150

## 2022-01-27 MED ORDER — FENTANYL CITRATE PF 50 MCG/ML IJ SOSY
50.0000 ug | PREFILLED_SYRINGE | Freq: Once | INTRAMUSCULAR | Status: AC
  Administered 2022-01-27: 50 ug via INTRAVENOUS
  Filled 2022-01-27: qty 1

## 2022-01-27 MED ORDER — METRONIDAZOLE 500 MG/100ML IV SOLN
500.0000 mg | Freq: Once | INTRAVENOUS | Status: AC
  Administered 2022-01-27: 500 mg via INTRAVENOUS
  Filled 2022-01-27: qty 100

## 2022-01-27 MED ORDER — METOPROLOL TARTRATE 5 MG/5ML IV SOLN: 2.5000 mg | Freq: Four times a day (QID) | INTRAVENOUS | Status: DC | PRN

## 2022-01-27 MED ORDER — LACTATED RINGERS IV BOLUS (SEPSIS)
1000.0000 mL | Freq: Once | INTRAVENOUS | Status: AC
  Administered 2022-01-27: 1000 mL via INTRAVENOUS

## 2022-01-27 MED ORDER — LACTATED RINGERS IV SOLN: INTRAVENOUS | Status: DC

## 2022-01-27 MED ORDER — VANCOMYCIN HCL 1500 MG/300ML IV SOLN
1500.0000 mg | Freq: Once | INTRAVENOUS | Status: AC
  Administered 2022-01-27: 1500 mg via INTRAVENOUS
  Filled 2022-01-27: qty 300

## 2022-01-27 MED ORDER — SENNOSIDES-DOCUSATE SODIUM 8.6-50 MG PO TABS: 1.0000 | ORAL_TABLET | Freq: Every evening | ORAL | Status: DC | PRN

## 2022-01-27 MED ORDER — HEPARIN (PORCINE) 25000 UT/250ML-% IV SOLN
1300.0000 [IU]/h | INTRAVENOUS | Status: DC
  Administered 2022-01-27 – 2022-01-29 (×2): 1300 [IU]/h via INTRAVENOUS
  Filled 2022-01-27 (×3): qty 250

## 2022-01-27 NOTE — ED Notes (Signed)
Patient resting in bed with daughter at bedside. Pt rearranged in bed.
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
## 2022-01-27 NOTE — Progress Notes (Addendum)
Pharmacy Antibiotic Note  Kristy Parsons is a 88 y.o. female admitted on 01/27/2022 presenting with leg pain, drainage.  Pharmacy has been consulted for vancomycin and cefepime dosing.  Plan: Vancomycin 1500 mg IV x 1, then 750 mg IV every 24h  Cefepime 2g IV every 12 hours Monitor renal function, Cx and clinical progression to narrow Vancomycin levels as needed     Temp (24hrs), Avg:97.7 F (36.5 C), Min:97.7 F (36.5 C), Max:97.7 F (36.5 C)  Recent Labs  Lab 01/21/22 2142 01/22/22 0338 01/23/22 0427 01/24/22 0328 01/25/22 0246 01/27/22 1304 01/27/22 1420  WBC 17.9* 12.9* 14.8* 14.5*  --  16.4*  --   CREATININE 0.79 0.83 0.85 0.85 0.93 1.00  --   LATICACIDVEN  --   --   --   --   --  2.2* 2.6*    Estimated Creatinine Clearance: 43.2 mL/min (by C-G formula based on SCr of 1 mg/dL).    Allergies  Allergen Reactions   Bactrim Rash   Nitrofurantoin Monohyd Macro Rash   Sulfa Antibiotics Hives and Rash   Azithromycin Diarrhea   Cortisone Nausea And Vomiting    Extreme pain at sign of injection     Ketorolac Nausea And Vomiting and Rash    hives     Bertis Ruddy, PharmD, [place] Pharmacist ED Pharmacist Phone # [phone] 01/27/2022 4:13 PM

## 2022-01-28 ENCOUNTER — Inpatient Hospital Stay (HOSPITAL_COMMUNITY): Payer: Medicare Other

## 2022-01-28 DIAGNOSIS — G4731 Primary central sleep apnea: Secondary | ICD-10-CM | POA: Diagnosis not present

## 2022-01-28 DIAGNOSIS — J9611 Chronic respiratory failure with hypoxia: Secondary | ICD-10-CM

## 2022-01-28 DIAGNOSIS — R9431 Abnormal electrocardiogram [ECG] [EKG]: Secondary | ICD-10-CM | POA: Diagnosis not present

## 2022-01-28 DIAGNOSIS — J449 Chronic obstructive pulmonary disease, unspecified: Secondary | ICD-10-CM

## 2022-01-28 DIAGNOSIS — I2609 Other pulmonary embolism with acute cor pulmonale: Secondary | ICD-10-CM

## 2022-01-28 DIAGNOSIS — R748 Abnormal levels of other serum enzymes: Secondary | ICD-10-CM | POA: Insufficient documentation

## 2022-01-28 DIAGNOSIS — I2699 Other pulmonary embolism without acute cor pulmonale: Secondary | ICD-10-CM

## 2022-01-28 DIAGNOSIS — R7989 Other specified abnormal findings of blood chemistry: Secondary | ICD-10-CM | POA: Insufficient documentation

## 2022-01-28 LAB — BASIC METABOLIC PANEL
Anion gap: 10 (ref 5–15)
BUN: 27 mg/dL — ABNORMAL HIGH (ref 8–23)
CO2: 23 mmol/L (ref 22–32)
Calcium: 8.2 mg/dL — ABNORMAL LOW (ref 8.9–10.3)
Chloride: 107 mmol/L (ref 98–111)
Creatinine, Ser: 0.83 mg/dL (ref 0.44–1.00)
GFR, Estimated: >60 mL/min (ref >60)
Glucose, Bld: 123 mg/dL — ABNORMAL HIGH (ref 70–99)
Potassium: 3.7 mmol/L (ref 3.5–5.1)
Sodium: 140 mmol/L (ref 135–145)

## 2022-01-28 LAB — HEPATIC FUNCTION PANEL
ALT: 52 U/L — ABNORMAL HIGH (ref 0–44)
AST: 43 U/L — ABNORMAL HIGH (ref 15–41)
Albumin: 2.1 g/dL — ABNORMAL LOW (ref 3.5–5.0)
Alkaline Phosphatase: 67 U/L (ref 38–126)
Bilirubin, Direct: 0.2 mg/dL (ref 0.0–0.2)
Indirect Bilirubin: 1 mg/dL — ABNORMAL HIGH (ref 0.3–0.9)
Total Bilirubin: 1.2 mg/dL (ref 0.3–1.2)
Total Protein: 5 g/dL — ABNORMAL LOW (ref 6.5–8.1)

## 2022-01-28 LAB — CK: Total CK: 76 U/L (ref 38–234)

## 2022-01-28 LAB — HEPARIN LEVEL (UNFRACTIONATED): Heparin Unfractionated: >1.1 IU/mL — ABNORMAL HIGH (ref 0.30–0.70)

## 2022-01-28 LAB — APTT
aPTT: 83 seconds — ABNORMAL HIGH (ref 24–36)
aPTT: 79 seconds — ABNORMAL HIGH (ref 24–36)

## 2022-01-28 LAB — ECHOCARDIOGRAM LIMITED
AR max vel: 1.1 cm2
AV Area VTI: 1.13 cm2
AV Area mean vel: 1.19 cm2
AV Mean grad: 12 mmHg
AV Peak grad: 21.9 mmHg
Ao pk vel: 2.34 m/s
Area-P 1/2: 5.13 cm2
S' Lateral: 2.8 cm

## 2022-01-28 LAB — CBC
HCT: 31 % — ABNORMAL LOW (ref 36.0–46.0)
Hemoglobin: 9.9 g/dL — ABNORMAL LOW (ref 12.0–15.0)
MCH: 28.4 pg (ref 26.0–34.0)
MCHC: 31.9 g/dL (ref 30.0–36.0)
MCV: 89.1 fL (ref 80.0–100.0)
Platelets: 275 10*3/uL (ref 150–400)
RBC: 3.48 MIL/uL — ABNORMAL LOW (ref 3.87–5.11)
RDW: 15.3 % (ref 11.5–15.5)
WBC: 13.9 10*3/uL — ABNORMAL HIGH (ref 4.0–10.5)
nRBC: 0.1 % (ref 0.0–0.2)

## 2022-01-28 LAB — TROPONIN I (HIGH SENSITIVITY): Troponin I (High Sensitivity): 99 ng/L — ABNORMAL HIGH (ref <18)

## 2022-01-28 LAB — LACTIC ACID, PLASMA
Lactic Acid, Venous: 1.2 mmol/L (ref 0.5–1.9)
Lactic Acid, Venous: 1.1 mmol/L (ref 0.5–1.9)

## 2022-01-28 MED ORDER — DILTIAZEM HCL 60 MG PO TABS
30.0000 mg | ORAL_TABLET | Freq: Four times a day (QID) | ORAL | Status: DC
  Administered 2022-01-29 (×2): 30 mg via ORAL
  Filled 2022-01-28 (×2): qty 1

## 2022-01-28 NOTE — ED Notes (Signed)
ED TO INPATIENT HANDOFF REPORT  ED Nurse Name and Phone #: Luetta Nutting [phone]  S Name/Age/Gender Kristy Parsons 88 y.o. female Room/Bed: 026C/026C  Code Status   Code Status: DNR  Home/SNF/Other Rehab Patient oriented to: self, place, time, and situation Is this baseline? Yes   Triage Complete: Triage complete  Chief Complaint Pulmonary emboli [facility]) [I26.99]  Triage Note Pt BIBGEMS from [place] center. Pt had complaints of pain in the right leg, redness, and drainage.   HR 130 BPM  Oxygen 96% on 3LPM [place] RR 32 CBG 195 ETC02 19 BP 126/78 after 250 NS initial pressures 35K systolic  22 g r hand     Allergies Allergies  Allergen Reactions   Bactrim Rash   Nitrofurantoin Monohyd Macro Rash   Sulfa Antibiotics Hives and Rash   Azithromycin Diarrhea   Cortisone Nausea And Vomiting    Extreme pain at sign of injection     Ketorolac Nausea And Vomiting and Rash    hives     Level of Care/Admitting Diagnosis ED Disposition     ED Disposition  Admit   Condition  --   [place]: [place] [[id number]]  Level of Care: Telemetry Medical [[id number]]  May admit patient to [facility] or [facility] if equivalent level of care is available:: No  Covid Evaluation: Asymptomatic - no recent exposure (last 10 days) testing not required  Diagnosis: Pulmonary emboli [facility]) [[id number]]  Admitting Physician: Lequita Halt [[id number]]  Attending Physician: Lequita Halt [[id number]]  Certification:: I certify this patient will need inpatient services for at least 2 midnights  Estimated Length of Stay: 2          B Medical/Surgery History Past Medical History:  Diagnosis Date   Asthma    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   Chronic respiratory failure with hypoxia ([place]) 01/22/2022   Complex sleep apnea syndrome 05/22/2010   COPD (chronic obstructive pulmonary disease) ([place]) 01/22/2022   High blood pressure    High cholesterol    Sleep-related  hypoventilation 07/09/2010   Past Surgical History:  Procedure Laterality Date   APPENDECTOMY     BREAST LUMPECTOMY     right breast   ORIF TIBIA PLATEAU Right 01/22/2022   Procedure: OPEN REDUCTION INTERNAL FIXATION (ORIF) TIBIAL PLATEAU;  Surgeon: Shona Needles, MD;  Location: [place];  Service: Orthopedics;  Laterality: Right;     A IV Location/Drains/Wounds Patient Lines/Drains/Airways Status     Active Line/Drains/Airways     Name Placement date Placement time Site Days   Peripheral IV 01/27/22 22 G Posterior;Right Hand 01/27/22  --  Hand  1   Peripheral IV 01/27/22 18 G Anterior;Distal;Left Forearm 01/27/22  1548  Forearm  1   Peripheral IV 01/27/22 22 G Left Antecubital 01/27/22  1825  Antecubital  1   Wound / Incision (Open or Dehisced) 01/22/22 Laceration Neck Right 01/22/22  0300  Neck  6            Intake/Output Last 24 hours  Intake/Output Summary (Last 24 hours) at 01/28/2022 0746 Last data filed at 01/28/2022 0230 Gross per 24 hour  Intake 2439.46 ml  Output --  Net 2439.46 ml    Labs/Imaging Results for orders placed or performed during the hospital encounter of 01/27/22 (from the past 48 hour(s))  Resp panel by RT-PCR (RSV, Flu A&B, Covid) Anterior Nasal Swab     Status: None   Collection Time: 01/27/22 12:47 PM  Specimen: Anterior Nasal Swab  Result Value Ref Range   SARS Coronavirus 2 by RT PCR NEGATIVE NEGATIVE    Comment: (NOTE) SARS-CoV-2 target nucleic acids are NOT DETECTED.  The SARS-CoV-2 RNA is generally detectable in upper respiratory specimens during the acute phase of infection. The lowest concentration of SARS-CoV-2 viral copies this assay can detect is 138 copies/mL. A negative result does not preclude SARS-Cov-2 infection and should not be used as the sole basis for treatment or other patient management decisions. A negative result may occur with  improper specimen collection/handling, submission of specimen other than  nasopharyngeal swab, presence of viral mutation(s) within the areas targeted by this assay, and inadequate number of viral copies(<138 copies/mL). A negative result must be combined with clinical observations, patient history, and epidemiological information. The expected result is Negative.  Fact Sheet for Patients:  EntrepreneurPulse.com.au  Fact Sheet for Healthcare Providers:  IncredibleEmployment.be  This test is no t yet approved or cleared by the [place] FDA and  has been authorized for detection and/or diagnosis of SARS-CoV-2 by FDA under an Emergency Use Authorization (EUA). This EUA will remain  in effect (meaning this test can be used) for the duration of the COVID-19 declaration under Section 564(b)(1) of the Act, 21 U.S.C.section 360bbb-3(b)(1), unless the authorization is terminated  or revoked sooner.       Influenza A by PCR NEGATIVE NEGATIVE   Influenza B by PCR NEGATIVE NEGATIVE    Comment: (NOTE) The Xpert Xpress SARS-CoV-2/FLU/RSV plus assay is intended as an aid in the diagnosis of influenza from Nasopharyngeal swab specimens and should not be used as a sole basis for treatment. Nasal washings and aspirates are unacceptable for Xpert Xpress SARS-CoV-2/FLU/RSV testing.  Fact Sheet for Patients: EntrepreneurPulse.com.au  Fact Sheet for Healthcare Providers: IncredibleEmployment.be  This test is not yet approved or cleared by the [place] FDA and has been authorized for detection and/or diagnosis of SARS-CoV-2 by FDA under an Emergency Use Authorization (EUA). This EUA will remain in effect (meaning this test can be used) for the duration of the COVID-19 declaration under Section 564(b)(1) of the Act, 21 U.S.C. section 360bbb-3(b)(1), unless the authorization is terminated or revoked.     Resp Syncytial Virus by PCR NEGATIVE NEGATIVE    Comment: (NOTE) Fact Sheet for  Patients: EntrepreneurPulse.com.au  Fact Sheet for Healthcare Providers: IncredibleEmployment.be  This test is not yet approved or cleared by the [place] FDA and has been authorized for detection and/or diagnosis of SARS-CoV-2 by FDA under an Emergency Use Authorization (EUA). This EUA will remain in effect (meaning this test can be used) for the duration of the COVID-19 declaration under Section 564(b)(1) of the Act, 21 U.S.C. section 360bbb-3(b)(1), unless the authorization is terminated or revoked.  Performed at [facility], [place] [address] [place], [place] [address]   Comprehensive metabolic panel     Status: Abnormal   Collection Time: 01/27/22  1:04 PM  Result Value Ref Range   Sodium 138 135 - 145 mmol/L   Potassium 4.9 3.5 - 5.1 mmol/L    Comment: HEMOLYSIS AT THIS LEVEL MAY AFFECT RESULT   Chloride 108 98 - 111 mmol/L   CO2 20 (L) 22 - 32 mmol/L   Glucose, Bld 151 (H) 70 - 99 mg/dL    Comment: Glucose reference range applies only to samples taken after fasting for at least 8 hours.   BUN 29 (H) 8 - 23 mg/dL   Creatinine, Ser 1.00 0.44 -  1.00 mg/dL   Calcium 8.3 (L) 8.9 - 10.3 mg/dL   Total Protein 4.9 (L) 6.5 - 8.1 g/dL   Albumin 2.2 (L) 3.5 - 5.0 g/dL   AST 92 (H) 15 - 41 U/L    Comment: HEMOLYSIS AT THIS LEVEL MAY AFFECT RESULT   ALT 72 (H) 0 - 44 U/L    Comment: HEMOLYSIS AT THIS LEVEL MAY AFFECT RESULT   Alkaline Phosphatase 88 38 - 126 U/L   Total Bilirubin 1.6 (H) 0.3 - 1.2 mg/dL    Comment: HEMOLYSIS AT THIS LEVEL MAY AFFECT RESULT   GFR, Estimated 55 (L) >60 mL/min    Comment: (NOTE) Calculated using the CKD-EPI Creatinine Equation (2021)    Anion gap 10 5 - 15    Comment: Performed at [facility], [place] [address], [place], [place] [address]  Lactic acid, plasma     Status: Abnormal   Collection Time: 01/27/22  1:04 PM  Result Value Ref Range   Lactic Acid, Venous 2.2 (HH) 0.5 - 1.9 mmol/L     Comment: CRITICAL RESULT CALLED TO, READ BACK BY AND VERIFIED WITH E.ANELLO,RN '@1422'$  01/27/2022 VANG.J Performed at [facility], [place] [address], [place], [place] [address]   CBC with Differential     Status: Abnormal   Collection Time: 01/27/22  1:04 PM  Result Value Ref Range   WBC 16.4 (H) 4.0 - 10.5 K/uL   RBC 3.88 3.87 - 5.11 MIL/uL   Hemoglobin 11.1 (L) 12.0 - 15.0 g/dL   HCT 35.7 (L) 36.0 - 46.0 %   MCV 92.0 80.0 - 100.0 fL   MCH 28.6 26.0 - 34.0 pg   MCHC 31.1 30.0 - 36.0 g/dL   RDW 15.4 11.5 - 15.5 %   Platelets 273 150 - 400 K/uL   nRBC 0.1 0.0 - 0.2 %   Neutrophils Relative % 76 %   Neutro Abs 12.5 (H) 1.7 - 7.7 K/uL   Lymphocytes Relative 9 %   Lymphs Abs 1.5 0.7 - 4.0 K/uL   Monocytes Relative 12 %   Monocytes Absolute 2.0 (H) 0.1 - 1.0 K/uL   Eosinophils Relative 1 %   Eosinophils Absolute 0.1 0.0 - 0.5 K/uL   Basophils Relative 1 %   Basophils Absolute 0.1 0.0 - 0.1 K/uL   Immature Granulocytes 1 %   Abs Immature Granulocytes 0.20 (H) 0.00 - 0.07 K/uL    Comment: Performed at [facility] [address], [place], [place] [address]  Magnesium     Status: None   Collection Time: 01/27/22  1:04 PM  Result Value Ref Range   Magnesium 2.3 1.7 - 2.4 mg/dL    Comment: Performed at [facility], [place] [address] [place], [place] [address]  Troponin I (High Sensitivity)     Status: Abnormal   Collection Time: 01/27/22  1:04 PM  Result Value Ref Range   Troponin I (High Sensitivity) 112 (HH) <18 ng/L    Comment: CRITICAL RESULT CALLED TO, READ BACK BY AND VERIFIED WITH E. ANELLNO RN 01/27/2022 1914 B NUNNERY (NOTE) Elevated high sensitivity troponin I (hsTnI) values and significant  changes across serial measurements may suggest ACS but many other  chronic and acute conditions are known to elevate hsTnI results.  Refer to the "Links" section for chest pain algorithms and additional  guidance. Performed at [facility], [place] [address]  [place], [place] [address]   Lactic acid, plasma  Status: Abnormal   Collection Time: 01/27/22  2:20 PM  Result Value Ref Range   Lactic Acid, Venous 2.6 (HH) 0.5 - 1.9 mmol/L    Comment: CRITICAL RESULT CALLED TO, READ BACK BY AND VERIFIED WITH L.[facility] RN [id number] 01/27/22 MCCORMICK K Performed at [facility] [address] [place], [place] [address]   Protime-INR     Status: Abnormal   Collection Time: 01/27/22  2:20 PM  Result Value Ref Range   Prothrombin Time 15.4 (H) 11.4 - 15.2 seconds   INR 1.2 0.8 - 1.2    Comment: (NOTE) INR goal varies based on device and disease states. Performed at [facility], [place] [address] [place], [place] [address]   Urinalysis, Routine w reflex microscopic     Status: Abnormal   Collection Time: 01/27/22  5:58 PM  Result Value Ref Range   Color, Urine YELLOW YELLOW   APPearance CLEAR CLEAR   Specific Gravity, Urine 1.015 1.005 - 1.030   pH 5.5 5.0 - 8.0   Glucose, UA NEGATIVE NEGATIVE mg/dL   Hgb urine dipstick SMALL (A) NEGATIVE   Bilirubin Urine SMALL (A) NEGATIVE   Ketones, ur NEGATIVE NEGATIVE mg/dL   Protein, ur 30 (A) NEGATIVE mg/dL   Nitrite NEGATIVE NEGATIVE   Leukocytes,Ua TRACE (A) NEGATIVE    Comment: Performed at [facility] [address], [place], [place] [address]  Urinalysis, Microscopic (reflex)     Status: Abnormal   Collection Time: 01/27/22  5:58 PM  Result Value Ref Range   RBC / HPF 0-5 0 - 5 RBC/hpf   WBC, UA 0-5 0 - 5 WBC/hpf   Bacteria, UA RARE (A) NONE SEEN   Squamous Epithelial / LPF 0-5 0 - 5    Comment: Performed at [facility], [place] [address] [place], [place] [address]  APTT     Status: Abnormal   Collection Time: 01/28/22  4:18 AM  Result Value Ref Range   aPTT 83 (H) 24 - 36 seconds    Comment:        IF BASELINE aPTT IS ELEVATED, SUGGEST PATIENT RISK ASSESSMENT BE USED TO DETERMINE APPROPRIATE ANTICOAGULANT THERAPY. Performed at [facility], [place] [address],  [place], [place] [address]   Heparin level (unfractionated)     Status: Abnormal   Collection Time: 01/28/22  4:18 AM  Result Value Ref Range   Heparin Unfractionated >1.10 (H) 0.30 - 0.70 IU/mL    Comment: (NOTE) The clinical reportable range upper limit is being lowered to >1.10 to align with the FDA approved guidance for the current laboratory assay.  If heparin results are below expected values, and patient dosage has  been confirmed, suggest follow up testing of antithrombin III levels. Performed at [facility], [place] [address], [place], [place] [address]   CBC     Status: Abnormal   Collection Time: 01/28/22  4:18 AM  Result Value Ref Range   WBC 13.9 (H) 4.0 - 10.5 K/uL   RBC 3.48 (L) 3.87 - 5.11 MIL/uL   Hemoglobin 9.9 (L) 12.0 - 15.0 g/dL   HCT 31.0 (L) 36.0 - 46.0 %   MCV 89.1 80.0 - 100.0 fL   MCH 28.4 26.0 - 34.0 pg   MCHC 31.9 30.0 - 36.0 g/dL   RDW 15.3 11.5 - 15.5 %   Platelets 275 150 - 400 K/uL   nRBC 0.1 0.0 - 0.2 %    Comment: Performed  at [facility], [place] [address] [place], [place] [address]  Basic metabolic panel     Status: Abnormal   Collection Time: 01/28/22  4:18 AM  Result Value Ref Range   Sodium 140 135 - 145 mmol/L   Potassium 3.7 3.5 - 5.1 mmol/L   Chloride 107 98 - 111 mmol/L   CO2 23 22 - 32 mmol/L   Glucose, Bld 123 (H) 70 - 99 mg/dL    Comment: Glucose reference range applies only to samples taken after fasting for at least 8 hours.   BUN 27 (H) 8 - 23 mg/dL   Creatinine, Ser 0.83 0.44 - 1.00 mg/dL   Calcium 8.2 (L) 8.9 - 10.3 mg/dL   GFR, Estimated >60 >60 mL/min    Comment: (NOTE) Calculated using the CKD-EPI Creatinine Equation (2021)    Anion gap 10 5 - 15    Comment: Performed at [facility] [address], [place], [place] [address]   CT Angio Chest PE W and/or Wo Contrast  Result Date: 01/27/2022 CLINICAL DATA:  Pulmonary embolus EXAM: CT ANGIOGRAPHY CHEST WITH CONTRAST TECHNIQUE: Multidetector CT imaging of  the chest was performed using the standard protocol during bolus administration of intravenous contrast. Multiplanar CT image reconstructions and MIPs were obtained to evaluate the vascular anatomy. RADIATION DOSE REDUCTION: This exam was performed according to the departmental dose-optimization program which includes automated exposure control, adjustment of the mA and/or kV according to patient size and/or use of iterative reconstruction technique. CONTRAST:  56m OMNIPAQUE IOHEXOL 350 MG/ML SOLN COMPARISON:  None Available. FINDINGS: Cardiovascular: Pulmonary embolus of the distal left pulmonary artery extending into the left upper lobe and lower lobe lobar arteries additional scattered areas of pulmonary embolus seen in bilateral segmental and subsegmental pulmonary arteries. CT evidence of right heart strain with RV to LV ratio of 1.2. normal overall heart size. Normal caliber thoracic aorta with severe calcified plaque. Mediastinum/Nodes: Esophagus and thyroid are unremarkable. No pathologically enlarged lymph nodes seen in the chest. Lungs/Pleura: Central airways are patent. Severe centrilobular emphysema. Bibasilar atelectasis no consolidation, pleural effusion or pneumothorax. Upper Abdomen: Cirrhotic liver morphology. Ill-defined lesion of the left hepatic lobe which appears new when compared with prior chest CT measuring approximately 3.6 x 4.3 cm on series 5 image 124. Musculoskeletal: Acute appearing fracture of the sternum fracture of the sternum, mild peristernal fat stranding is likely due to hematoma. And anterior left 7th - 10th ribs. Additional chronic appearing left-sided rib fractures are seen. Review of the MIP images confirms the above findings. IMPRESSION: 1. Pulmonary embolus of the distal left pulmonary artery extending into the left upper lobe and lower lobe lobar arteries. Additional areas of pulmonary embolus seen in bilateral segmental and subsegmental pulmonary arteries. 2. CT evidence  of right heart strain with RV to LV ratio of 1.2. Recommend correlation with echocardiography. 3. Acute appearing fractures of the sternum and anterior left 7th - 10th ribs. 4. New ill-defined left hepatic lobe lesion. Recommend contrast-enhanced liver MRI for further evaluation. 5. Cirrhotic liver morphology. 6. Aortic Atherosclerosis (ICD10-I70.0) and Emphysema (ICD10-J43.9). Critical Value/emergent results (pulmonary embolus) were called by telephone at the time of interpretation on 01/27/2022 at 6:27 pm to provider RGodfrey Pick, who verbally acknowledged these results. Electronically Signed   By: LYetta GlassmanM.D.   On: 01/27/2022 18:31   DG Tibia/Fibula Right  Result Date: 01/27/2022 CLINICAL DATA:  Postop.  Knee wound. EXAM: RIGHT TIBIA AND FIBULA - 2 VIEW COMPARISON:  01/22/2022. FINDINGS: Lateral fixation plate and associated screws reduce the tibial plateau fracture into near anatomic alignment, unchanged from the prior exam. No new fracture.  No bone lesion. No bone resorption to suggest osteomyelitis. Knee and ankle joints are normally aligned. Small knee joint effusion. Subcutaneous soft tissue air is noted along the anterior aspect of the knee. There is diffuse subcutaneous soft tissue edema. IMPRESSION: 1. Soft tissue air along the anterior aspect of the knee. Diffuse subcutaneous soft tissue edema. 2. No evidence of osteomyelitis. Orthopedic hardware from the ORIF of the tibial plateau fracture is stable, fracture remains well aligned. Electronically Signed   By: Lajean Manes M.D.   On: 01/27/2022 13:59   DG Chest Port 1 View  Result Date: 01/27/2022 CLINICAL DATA:  Fever, recent knee surgery EXAM: PORTABLE CHEST 1 VIEW COMPARISON:  01/23/2022 FINDINGS: Transverse diameter of heart is increased. There are no signs of pulmonary edema. There are linear densities in right parahilar region and both lower lung fields, more so in the left lower lung field. There is blunting of left lateral CP  angle. There is no pneumothorax. Surgical clips are seen in right chest wall. IMPRESSION: Linear densities are seen in right parahilar region and both lower lung fields, more so in the left lower lung field suggesting atelectasis/pneumonia. No significant interval changes are noted. Possible minimal left pleural effusion. Electronically Signed   By: Elmer Picker M.D.   On: 01/27/2022 13:05    Pending Labs Unresulted Labs (From admission, onward)     Start     Ordered   01/28/22 1300  APTT  Once-Timed,   TIMED        01/28/22 0601   01/28/22 0719  CK  Add-on,   AD        01/28/22 0718   01/28/22 0718  Hepatic function panel  Add-on,   AD        01/28/22 0718   01/28/22 0718  Lactic acid, plasma  STAT Now then every 3 hours,   R      01/28/22 0718   01/28/22 0500  CBC  Daily,   R      01/27/22 1900   01/27/22 1222  Culture, blood (Routine x 2)  BLOOD CULTURE X 2,   R      01/27/22 1222            Vitals/Pain Today's Vitals   01/28/22 0500 01/28/22 0625 01/28/22 0715 01/28/22 0730  BP: 123/61 123/62 125/60 129/63  Pulse: (!) 57  60 66  Resp: '18  16 17  '$ Temp:      TempSrc:      SpO2: 95%  96% 95%  PainSc:        Isolation Precautions No active isolations  Medications Medications  adenosine (ADENOCARD) 6 MG/2ML injection 6 mg (0 mg Intravenous Hold 01/27/22 1633)  lactated ringers infusion ( Intravenous New Bag/Given 01/27/22 1641)  vancomycin (VANCOREADY) IVPB 750 mg/150 mL (has no administration in time range)  heparin ADULT infusion 100 units/mL (25000 units/270m) (1,300 Units/hr Intravenous New Bag/Given 01/27/22 1939)  piperacillin-tazobactam (ZOSYN) IVPB 3.375 g ( Intravenous Canceled Entry 01/28/22 0600)  acetaminophen (TYLENOL) tablet 500 mg (has no administration in time range)  HYDROcodone-acetaminophen (NORCO/VICODIN) 5-325 MG per tablet 1 tablet (1 tablet Oral Given 01/27/22 2254)  meloxicam (MOBIC) tablet 7.5 mg (has no administration in time range)   rosuvastatin (CRESTOR) tablet 5 mg (5 mg Oral Given 01/27/22 2253)  diltiazem (CARDIZEM) tablet 60 mg (60  mg Oral Given 01/28/22 0625)  metoprolol tartrate (LOPRESSOR) injection 2.5 mg (has no administration in time range)  venlafaxine XR (EFFEXOR-XR) 24 hr capsule 75 mg (has no administration in time range)  pantoprazole (PROTONIX) EC tablet 40 mg (has no administration in time range)  calcium carbonate (OS-CAL - dosed in mg of elemental calcium) tablet 1,250 mg (has no administration in time range)  cholecalciferol (VITAMIN D3) 25 MCG (1000 UNIT) tablet 2,000 Units (has no administration in time range)  fluticasone furoate-vilanterol (BREO ELLIPTA) 100-25 MCG/ACT 1 puff (1 puff Inhalation Not Given 01/28/22 0020)  polyvinyl alcohol (LIQUIFILM TEARS) 1.4 % ophthalmic solution 1 drop (has no administration in time range)  clobetasol cream (TEMOVATE) 9.79 % 1 Application (has no administration in time range)  hydrocortisone (ANUSOL-HC) 2.5 % rectal cream 1 Application (has no administration in time range)  bisacodyl (DULCOLAX) EC tablet 5 mg (has no administration in time range)  senna-docusate (Senokot-S) tablet 1 tablet (has no administration in time range)  ondansetron (ZOFRAN) tablet 4 mg (has no administration in time range)    Or  ondansetron (ZOFRAN) injection 4 mg (has no administration in time range)  0.9 %  sodium chloride infusion ( Intravenous New Bag/Given 01/28/22 0030)  lactated ringers bolus 1,000 mL (0 mLs Intravenous Stopped 01/27/22 1404)  fentaNYL (SUBLIMAZE) injection 50 mcg (50 mcg Intravenous Given 01/27/22 1258)  metroNIDAZOLE (FLAGYL) IVPB 500 mg (0 mg Intravenous Stopped 01/27/22 1824)  vancomycin (VANCOREADY) IVPB 1500 mg/300 mL (0 mg Intravenous Stopped 01/27/22 2143)  lactated ringers bolus 1,000 mL (0 mLs Intravenous Stopped 01/27/22 1926)  ondansetron (ZOFRAN) injection 4 mg (4 mg Intravenous Given 01/27/22 1944)  iohexol (OMNIPAQUE) 350 MG/ML injection 75 mL  (75 mLs Intravenous Contrast Given 01/27/22 1816)  heparin bolus via infusion 3,500 Units (3,500 Units Intravenous Bolus from Bag 01/27/22 1939)    Mobility non-ambulatory Low fall risk   Focused Assessments    R Recommendations: See Admitting Provider Note  Report given to:   Additional Notes:

## 2022-01-28 NOTE — ED Notes (Signed)
Patient resting in bed with grand daughter at bedside.
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
## 2022-01-28 NOTE — ED Notes (Signed)
Patient informed RN that her husband was still in the waiting room waiting to be seen. Charge RN informed that pt husband would be the next to come back if acuity permits. RN informed pt. Pt denies any other needs.

## 2022-01-28 NOTE — Progress Notes (Signed)
Lower extremity venous bilateral study completed.   Please see CV Proc for preliminary results.   Craig Wisnewski, RDMS, RVT  

## 2022-01-28 NOTE — Progress Notes (Signed)
Patient ID: Kristy Parsons, female   DOB: 1933/06/12, 88 y.o.   MRN: [id number]   LOS: 1 day   Subjective: Not c/o incisional pain at this time. Says it feels about the same as usual.   Objective: Vital signs in last 24 hours: Temp:  [97.5 F (36.4 C)-98 F (36.7 C)] 98 F (36.7 C) (12/26 1202) Pulse Rate:  [56-133] 61 (12/26 1202) Resp:  [16-33] 17 (12/26 1202) BP: (100-144)/(53-85) 133/53 (12/26 1202) SpO2:  [94 %-100 %] 100 % (12/26 1202)     Laboratory  CBC Recent Labs    01/27/22 1304 01/28/22 0418  WBC 16.4* 13.9*  HGB 11.1* 9.9*  HCT 35.7* 31.0*  PLT 273 275   BMET Recent Labs    01/27/22 1304 01/28/22 0418  NA 138 140  K 4.9 3.7  CL 108 107  CO2 20* 23  GLUCOSE 151* 123*  BUN 29* 27*  CREATININE 1.00 0.83  CALCIUM 8.3* 8.2*     Physical Exam General appearance: alert and no distress RLE: Incision C/D/I. No surrounding erythema, fluctuance, or discharge. Just mild discomfort with palpation. Blood-filled bulla proximal third medial to incision ~2cm in diameter.   Assessment/Plan: S/p ORIF right tibial plateau -- No indication of surgical site infection. Continue PT/OT as directed.    Lisette Abu, PA-C Orthopedic Surgery [phone] 01/28/2022

## 2022-01-28 NOTE — Progress Notes (Signed)
  Echocardiogram 2D Echocardiogram has been performed.  Kristy Parsons 01/28/2022, 10:33 AM

## 2022-01-28 NOTE — Progress Notes (Signed)
ANTICOAGULATION CONSULT NOTE - Follow Up Consult  Pharmacy Consult for heparin Indication: atrial fibrillation and pulmonary embolus  Labs: Recent Labs    01/27/22 1304 01/27/22 1420 01/28/22 0418  HGB 11.1*  --  9.9*  HCT 35.7*  --  31.0*  PLT 273  --  275  APTT  --   --  83*  LABPROT  --  15.4*  --   INR  --  1.2  --   HEPARINUNFRC  --   --  >1.10*  CREATININE 1.00  --  0.83  TROPONINIHS 112*  --   --     Assessment/Plan:  88yo female therapeutic on heparin with initial dosing for PE, on Eliquis PTA for PAF. Will continue infusion at current rate of 1300 units/hr and confirm stable with additional PTT.   Wynona Neat, PharmD, BCPS  01/28/2022,5:59 AM

## 2022-01-28 NOTE — Progress Notes (Signed)
[place] for heparin Indication: pulmonary embolus  Allergies  Allergen Reactions   Bactrim Rash   Nitrofurantoin Monohyd Macro Rash   Sulfa Antibiotics Hives and Rash   Azithromycin Diarrhea   Cortisone Nausea And Vomiting    Extreme pain at sign of injection     Ketorolac Nausea And Vomiting and Rash    hives     Patient Measurements:   Heparin Dosing Weight: 84kg  Vital Signs: Temp: 98 F (36.7 C) (12/26 1202) Temp Source: Oral (12/26 1202) BP: 133/53 (12/26 1202) Pulse Rate: 61 (12/26 1202)  Labs: Recent Labs    01/27/22 1304 01/27/22 1420 01/28/22 0418 01/28/22 1233  HGB 11.1*  --  9.9*  --   HCT 35.7*  --  31.0*  --   PLT 273  --  275  --   APTT  --   --  83* 79*  LABPROT  --  15.4*  --   --   INR  --  1.2  --   --   HEPARINUNFRC  --   --  >1.10*  --   CREATININE 1.00  --  0.83  --   TROPONINIHS 112*  --   --   --      Estimated Creatinine Clearance: 52.1 mL/min (by C-G formula based on SCr of 0.83 mg/dL).   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 Pt on apixaban 2.'5mg'$  BID PTA for ortho VTE ppx with last dose 12/25 ~0830. Patient w/ Afib noted last admit but was decided to forgeo full dose anticoag per MD d/w family given hx of falls. Pharmacy consulted to dose IV heparin for acute vs subacute PE.  Confirmatory aPTT therapeutic at 79 seconds with heparin infusing at 1300 units/hr. Noted hemoglobin drop 11 > 9.9, however patient was receiving NS + LR so could be dilutional. No issue with infusion per RN.   Goal of  Therapy:  Heparin level 0.3-0.7 units/ml aPTT 66-102 seconds Monitor platelets by anticoagulation protocol: Yes   Plan:  - Continue heparin at 1300 units/hr - Daily aPTT and heparin level - Monitor for s/sx bleeding - F/u ability to resume oral anticoagulation  Dimple Nanas, PharmD, BCPS 01/28/2022 1:32 PM

## 2022-01-28 NOTE — Progress Notes (Addendum)
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 REDUCTION: This exam was performed according to the departmental dose-optimization program which includes automated exposure control, adjustment of the mA and/or kV according to patient size and/or use of iterative reconstruction technique. CONTRAST:  33m OMNIPAQUE IOHEXOL 350 MG/ML SOLN COMPARISON:  None Available. FINDINGS: Cardiovascular: Pulmonary embolus of the distal left pulmonary artery extending into the left upper lobe and lower lobe lobar arteries additional scattered areas of pulmonary embolus seen in bilateral segmental and subsegmental pulmonary arteries. CT evidence of right heart strain with RV to LV ratio of 1.2. normal  overall heart size. Normal caliber thoracic aorta with severe calcified plaque. Mediastinum/Nodes: Esophagus and thyroid are unremarkable. No pathologically enlarged lymph nodes seen in the chest. Lungs/Pleura: Central airways are patent. Severe centrilobular emphysema. Bibasilar atelectasis no consolidation, pleural effusion or pneumothorax. Upper Abdomen: Cirrhotic liver morphology. Ill-defined lesion of the left hepatic lobe which appears new when compared with prior chest CT measuring approximately 3.6 x 4.3 cm on series 5 image 124. Musculoskeletal: Acute appearing fracture of the sternum fracture of the sternum, mild peristernal fat stranding is likely due to hematoma. And anterior left 7th - 10th ribs. Additional chronic appearing left-sided rib fractures are seen. Review of the MIP images confirms the above findings. IMPRESSION: 1. Pulmonary embolus of the distal left pulmonary artery extending into the left upper lobe and lower lobe lobar arteries. Additional areas of pulmonary embolus seen in bilateral segmental and subsegmental pulmonary arteries. 2. CT evidence of right heart strain with RV to LV ratio of 1.2. Recommend correlation with echocardiography. 3. Acute appearing fractures of the sternum and anterior left 7th - 10th ribs. 4. New ill-defined left hepatic lobe lesion. Recommend contrast-enhanced liver MRI for further evaluation. 5. Cirrhotic liver morphology. 6. Aortic Atherosclerosis (ICD10-I70.0) and Emphysema (ICD10-J43.9). Critical Value/emergent results (pulmonary embolus) were called by telephone at the time of interpretation on 01/27/2022 at 6:27 pm to provider RGodfrey Pick, who verbally acknowledged these results. Electronically Signed   By: LYetta GlassmanM.D.   On: 01/27/2022 18:31      Petrice Beedy T. GRosalie If 7PM-7AM, please contact night-coverage [email or web address] 01/28/2022, 3:15 PM

## 2022-01-29 DIAGNOSIS — I2609 Other pulmonary embolism with acute cor pulmonale: Secondary | ICD-10-CM | POA: Diagnosis not present

## 2022-01-29 DIAGNOSIS — K769 Liver disease, unspecified: Secondary | ICD-10-CM

## 2022-01-29 LAB — COMPREHENSIVE METABOLIC PANEL
ALT: 47 U/L — ABNORMAL HIGH (ref 0–44)
AST: 41 U/L (ref 15–41)
Albumin: 2.1 g/dL — ABNORMAL LOW (ref 3.5–5.0)
Alkaline Phosphatase: 67 U/L (ref 38–126)
Anion gap: 10 (ref 5–15)
BUN: 22 mg/dL (ref 8–23)
CO2: 22 mmol/L (ref 22–32)
Calcium: 8.4 mg/dL — ABNORMAL LOW (ref 8.9–10.3)
Chloride: 109 mmol/L (ref 98–111)
Creatinine, Ser: 0.94 mg/dL (ref 0.44–1.00)
GFR, Estimated: 59 mL/min — ABNORMAL LOW (ref >60)
Glucose, Bld: 102 mg/dL — ABNORMAL HIGH (ref 70–99)
Potassium: 3.9 mmol/L (ref 3.5–5.1)
Sodium: 141 mmol/L (ref 135–145)
Total Bilirubin: 1.4 mg/dL — ABNORMAL HIGH (ref 0.3–1.2)
Total Protein: 4.9 g/dL — ABNORMAL LOW (ref 6.5–8.1)

## 2022-01-29 LAB — CBC
HCT: 32.7 % — ABNORMAL LOW (ref 36.0–46.0)
Hemoglobin: 10.1 g/dL — ABNORMAL LOW (ref 12.0–15.0)
MCH: 27.7 pg (ref 26.0–34.0)
MCHC: 30.9 g/dL (ref 30.0–36.0)
MCV: 89.8 fL (ref 80.0–100.0)
Platelets: 238 10*3/uL (ref 150–400)
RBC: 3.64 MIL/uL — ABNORMAL LOW (ref 3.87–5.11)
RDW: 15.2 % (ref 11.5–15.5)
WBC: 11 10*3/uL — ABNORMAL HIGH (ref 4.0–10.5)
nRBC: 0.2 % (ref 0.0–0.2)

## 2022-01-29 LAB — MAGNESIUM: Magnesium: 2 mg/dL (ref 1.7–2.4)

## 2022-01-29 LAB — PHOSPHORUS: Phosphorus: 3.5 mg/dL (ref 2.5–4.6)

## 2022-01-29 LAB — APTT: aPTT: 89 seconds — ABNORMAL HIGH (ref 24–36)

## 2022-01-29 LAB — HEPARIN LEVEL (UNFRACTIONATED): Heparin Unfractionated: 0.99 IU/mL — ABNORMAL HIGH (ref 0.30–0.70)

## 2022-01-29 MED ORDER — HYDROCODONE-ACETAMINOPHEN 5-325 MG PO TABS: 1.0000 | ORAL_TABLET | ORAL | 0 refills | Status: AC | PRN

## 2022-01-29 MED ORDER — APIXABAN 5 MG PO TABS: 5.0000 mg | ORAL_TABLET | Freq: Two times a day (BID) | ORAL | Status: DC

## 2022-01-29 MED ORDER — APIXABAN (ELIQUIS) VTE STARTER PACK (10MG AND 5MG): ORAL_TABLET | ORAL | 0 refills | Status: AC

## 2022-01-29 MED ORDER — APIXABAN 5 MG PO TABS
10.0000 mg | ORAL_TABLET | Freq: Two times a day (BID) | ORAL | Status: DC
  Administered 2022-01-29: 10 mg via ORAL
  Filled 2022-01-29: qty 2

## 2022-01-29 NOTE — Plan of Care (Signed)

## 2022-01-29 NOTE — TOC Transition Note (Signed)
Transition of Care [facility]) - CM/SW Discharge Note   Patient Details  Name: Kristy Parsons MRN: [id number] Date of Birth: 01-18-1934  [facility] CM/SW Contact:  Coralee Pesa, [place] Phone Number: 01/29/2022, 11:12 AM   Clinical Narrative:    Pt to be transported to [facility] via [facility]. Nurse to call report to [phone].   Final next level of care: [place] Barriers to Discharge: Barriers Resolved   Patient Goals and CMS Choice      Discharge Placement                Patient chooses bed at: [facility] Patient to be transferred to facility by: [facility] Name of family member notified: Hoyle Sauer (daughter) Patient and family notified of of transfer: 01/29/22  Discharge Plan and Services Additional resources added to the After Visit Summary for                                       Social Determinants of Health (SDOH) Interventions SDOH Screenings   Food Insecurity: No Food Insecurity (01/22/2022)  Housing: Low Risk  (01/22/2022)  Transportation Needs: No Transportation Needs (01/22/2022)  Utilities: Not At Risk (01/22/2022)  Tobacco Use: Medium Risk (01/23/2022)     Readmission Risk Interventions     No data to display

## 2022-01-29 NOTE — Progress Notes (Signed)
[place] for heparin >> Eliquis Indication: pulmonary embolus  Allergies  Allergen Reactions   Bactrim Rash   Nitrofurantoin Monohyd Macro Rash   Sulfa Antibiotics Hives and Rash   Azithromycin Diarrhea   Cortisone Nausea And Vomiting    Extreme pain at sign of injection     Ketorolac Nausea And Vomiting and Rash    hives     Patient Measurements:   Heparin Dosing Weight: 84kg  Vital Signs: Temp: 98.5 F (36.9 C) (12/27 0828) Temp Source: Oral (12/27 0828) BP: 148/65 (12/27 0828) Pulse Rate: 74 (12/27 0828)  Labs: Recent Labs    01/27/22 1304 01/27/22 1420 01/28/22 0418 01/28/22 1233 01/29/22 0255 01/29/22 0801  HGB 11.1*  --  9.9*  --  10.1*  --   HCT 35.7*  --  31.0*  --  32.7*  --   PLT 273  --  275  --  238  --   APTT  --   --  83* 79*  --  89*  LABPROT  --  15.4*  --   --   --   --   INR  --  1.2  --   --   --   --   HEPARINUNFRC  --   --  >1.10*  --   --  0.99*  CREATININE 1.00  --  0.83  --  0.94  --   CKTOTAL  --   --   --  76  --   --   TROPONINIHS 112*  --   --  99*  --   --      Estimated Creatinine Clearance: 46 mL/min (by C-G formula based on SCr of 0.94 mg/dL).   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 Pt on apixaban 2.'5mg'$  BID PTA for ortho VTE ppx with last dose 12/25 ~0830. Patient w/ Afib noted last admit but was decided to forgeo full dose anticoag per MD d/w family given hx of falls. Pharmacy consulted to transition IV heparin to Eliquis for PE.  CBC stable  Goal of Therapy:  Monitor platelets by  anticoagulation protocol: Yes   Plan:  - Discontinue heparin drip and associated labs - Begin Eliquis '10mg'$  PO BID x 7 days, followed by '5mg'$  PO BID thereafter - Pharmacy to provide education prior to discharge  Dimple Nanas, PharmD, BCPS 01/29/2022 9:10 AM

## 2022-01-29 NOTE — Progress Notes (Signed)
Report called nurse Elmyra Ricks at  [facility] at [place]

## 2022-01-29 NOTE — Discharge Summary (Addendum)
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 SARS-CoV-2/FLU/RSV plus assay is intended as an aid in the diagnosis of influenza from Nasopharyngeal swab specimens and should not be used as a sole basis for treatment. Nasal washings and aspirates are unacceptable for Xpert Xpress SARS-CoV-2/FLU/RSV testing.  Fact Sheet for Patients: EntrepreneurPulse.com.au  Fact Sheet for Healthcare Providers: IncredibleEmployment.be  This test is not yet approved or cleared by the [place] FDA and has been authorized for detection and/or diagnosis of SARS-CoV-2 by FDA under an Emergency Use Authorization (EUA). This EUA will remain in effect (meaning this test can be used) for the duration of the COVID-19 declaration under Section 564(b)(1) of the Act, 21 U.S.C. section 360bbb-3(b)(1), unless the authorization is terminated or revoked.     Resp Syncytial  Virus by PCR NEGATIVE NEGATIVE Final    Comment: (NOTE) Fact Sheet for Patients: EntrepreneurPulse.com.au  Fact Sheet for Healthcare Providers: IncredibleEmployment.be  This test is not yet approved or cleared by the [place] FDA and has been authorized for detection and/or diagnosis of SARS-CoV-2 by FDA under an Emergency Use Authorization (EUA). This EUA will remain in effect (meaning this test can be used) for the duration of the COVID-19 declaration under Section 564(b)(1) of the Act, 21 U.S.C. section 360bbb-3(b)(1), unless the authorization is terminated or revoked.  Performed at [facility], [address], [place], [place] [address]      Discharge Instructions:   Discharge Instructions     Diet - low sodium heart healthy   Complete by: As directed    Increase activity slowly   Complete by: As directed    No wound care   Complete by: As directed       Allergies as of 01/29/2022       Reactions   Bactrim Rash   Nitrofurantoin Monohyd Macro Rash   Sulfa Antibiotics Hives, Rash   Azithromycin Diarrhea   Cortisone Nausea And Vomiting   Extreme pain at sign of injection   Ketorolac Nausea And Vomiting, Rash   hives        Medication List     STOP taking these medications    apixaban 2.5 MG Tabs tablet Commonly known as: Eliquis Replaced by: Apixaban Starter Pack ('10mg'$  and '5mg'$ )   metoprolol tartrate 25 MG tablet Commonly known as: LOPRESSOR       TAKE these medications    acetaminophen 500 MG tablet Commonly known as: TYLENOL Take 500 mg by mouth every 6 (six) hours as needed for moderate pain.   amLODipine-valsartan 10-160 MG tablet Commonly known as: EXFORGE Take 1 tablet by mouth daily.   Apixaban Starter Pack ('10mg'$  and '5mg'$ ) Commonly known as: ELIQUIS STARTER PACK Take as directed on package: start with two-'5mg'$  tablets twice daily for 7 days. On day 8, switch to one-'5mg'$  tablet twice  daily. Replaces: apixaban 2.5 MG Tabs tablet   Breo Ellipta 100-25 MCG/ACT Aepb Generic drug: fluticasone furoate-vilanterol Inhale 1 puff into the lungs daily.   calcium carbonate 1500 (600 Ca) MG Tabs tablet Commonly known as: OSCAL Take 1,500 mg by  mouth daily.   clobetasol 0.05 % external solution Commonly known as: TEMOVATE Apply 1 application topically 2 (two) times daily as needed (itching on scalp).   clotrimazole 1 % cream Commonly known as: LOTRIMIN Apply 1 Application topically 2 (two) times daily.   diltiazem 180 MG 24 hr capsule Commonly known as: CARDIZEM CD Take 180 mg by mouth daily.   doxycycline 100 MG tablet Commonly known as: VIBRA-TABS Take 1 tablet (100 mg total) by mouth 2 (two) times daily for 5 days.   Glucosamine 500 MG Caps Take 500 mg by mouth daily. Once a day   HYDROcodone-acetaminophen 5-325 MG tablet Commonly known as: NORCO/VICODIN Take 1 tablet by mouth every 4 (four) hours as needed for moderate pain or severe pain.   hydrocortisone 2.5 % rectal cream Commonly known as: ANUSOL-HC Place 1 Application rectally daily as needed for hemorrhoids or anal itching.   magnesium oxide 400 (240 Mg) MG tablet Commonly known as: MAG-OX Take 400 mg by mouth daily.   meloxicam 7.5 MG tablet Commonly known as: MOBIC Take 7.5 mg by mouth daily.   multivitamin-lutein Caps capsule Take 1 capsule by mouth daily.   omeprazole 40 MG capsule Commonly known as: PRILOSEC Take 40 mg by mouth daily.   REFRESH PLUS OP Place 1 drop into both eyes daily as needed (dry eyes).   rosuvastatin 5 MG tablet Commonly known as: CRESTOR Take 5 mg by mouth at bedtime.   venlafaxine XR 75 MG 24 hr capsule Commonly known as: EFFEXOR-XR Take 75 mg by mouth daily.   Vitamin D3 50 MCG (2000 UT) capsule Take 1 capsule (2,000 Units total) by mouth daily.          Time coordinating discharge: 45 min  Signed:  Geradine Girt DO  [facility] 01/29/2022, 10:24 AM

## 2022-01-29 NOTE — Discharge Instructions (Addendum)
Information on my medicine - ELIQUIS (apixaban)  Why was Eliquis prescribed for you? Eliquis was prescribed to treat blood clots that may have been found in the veins of your legs (deep vein thrombosis) or in your lungs (pulmonary embolism) and to reduce the risk of them occurring again.  What do You need to know about Eliquis ? The starting dose is 10 mg (two 5 mg tablets) taken TWICE daily for the FIRST SEVEN (7) DAYS, then on (enter date)  02/05/22  the dose is reduced to ONE 5 mg tablet taken TWICE daily.  Eliquis may be taken with or without food.   Try to take the dose about the same time in the morning and in the evening. If you have difficulty swallowing the tablet whole please discuss with your pharmacist how to take the medication safely.  Take Eliquis exactly as prescribed and DO NOT stop taking Eliquis without talking to the doctor who prescribed the medication.  Stopping may increase your risk of developing a new blood clot.  Refill your prescription before you run out.  After discharge, you should have regular check-up appointments with your healthcare provider that is prescribing your Eliquis.    What do you do if you miss a dose? If a dose of ELIQUIS is not taken at the scheduled time, take it as soon as possible on the same day and twice-daily administration should be resumed. The dose should not be doubled to make up for a missed dose.  Important Safety Information A possible side effect of Eliquis is bleeding. You should call your healthcare provider right away if you experience any of the following: Bleeding from an injury or your nose that does not stop. Unusual colored urine (red or dark brown) or unusual colored stools (red or black). Unusual bruising for unknown reasons. A serious fall or if you hit your head (even if there is no bleeding).  Some medicines may interact with Eliquis and might increase your risk of bleeding or clotting while on Eliquis. To help  avoid this, consult your healthcare provider or pharmacist prior to using any new prescription or non-prescription medications, including herbals, vitamins, non-steroidal anti-inflammatory drugs (NSAIDs) and supplements.  This website has more information on Eliquis (apixaban): http://www.eliquis.com/eliquis/home

## 2022-01-29 NOTE — Progress Notes (Signed)
[facility] here to pick up pt to transport to SNF,  pt is "lethargic" sleepy---daughter concerned ---although she states "mama is a heavy sleeper". she had her scheduled meds this morning, took whole with water, kept her eyes closed then ---but she could tell me her birthday and name---VSS. 124/59, 72, 92% 2L, 18 RR. PTAR also concerned.  Message sent to Dr Eliseo Squires giving the above information and requesting her to come see pt.   PTAR was sent on another call and pt will be rescheduled for pick up, (SW was notified to verify pt was put back on list with PTAR for pick up).   Pt ended up talking a little with daughter after PTAR had been sent on another call.    At 72  Dr Eliseo Squires came up to see pt, pt did answer Dr Gaynelle Adu questions and she opened her eyes for her and nurse.  Pt okay to go to SNF. PT[facility]  will be back to pick pt up.

## 2022-01-29 NOTE — Progress Notes (Signed)
[facility] here for transport to SNF.  Pt VSS 138/70, 71, 93% on 2L O2.   Pt awake, eyes open and talking to daughter, and transporters.  Pt will d/c with belongings, taken by daughter.  Report to be called to facility.

## 2022-01-29 NOTE — Progress Notes (Signed)
Patient refused CPAP for the night  

## 2022-02-01 LAB — CULTURE, BLOOD (ROUTINE X 2)
Culture: NO GROWTH
Special Requests: ADEQUATE

## 2022-02-02 LAB — CULTURE, BLOOD (ROUTINE X 2)
Culture: NO GROWTH
Special Requests: ADEQUATE

## 2022-08-30 IMAGING — CT CT HEAD W/O CM
3 series · 16 of 47 positions shown, 19 images · non-contrast
Comparison: CT head December 23, 2017.

CLINICAL DATA: Trauma.

EXAM:
CT HEAD WITHOUT CONTRAST
TECHNIQUE: Contiguous axial images were obtained from the base of the skull
through the vertex without intravenous contrast.

[Series 2: head wo · axial · 0.40mm/px · z∈[-281,-156]mm · 10 of 31 slices shown, 13 images]
[im 3/31  brain]
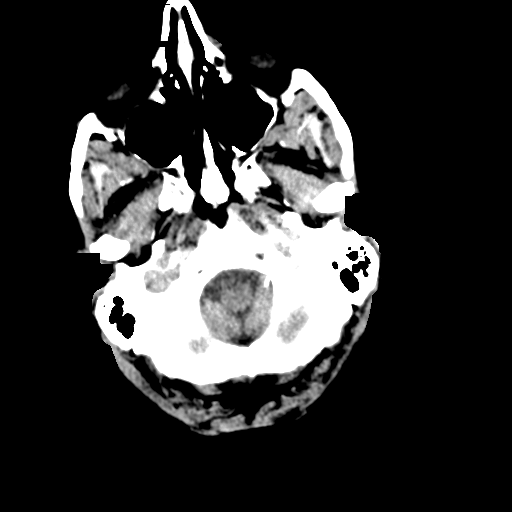
[im 3/31  bone]
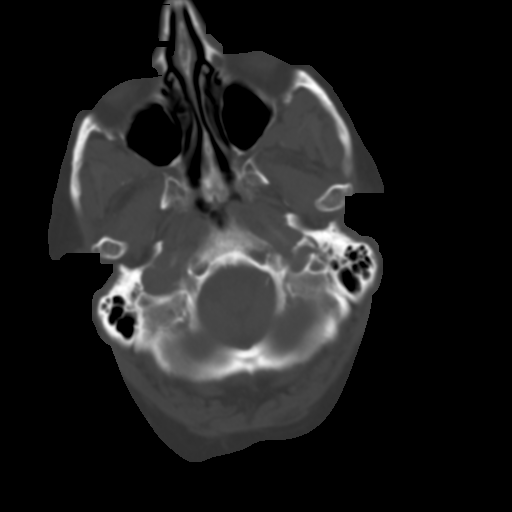
[im 6/31  brain]
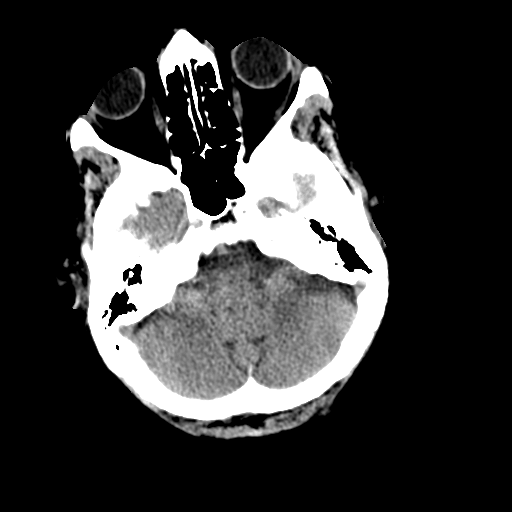
[im 9/31  brain]
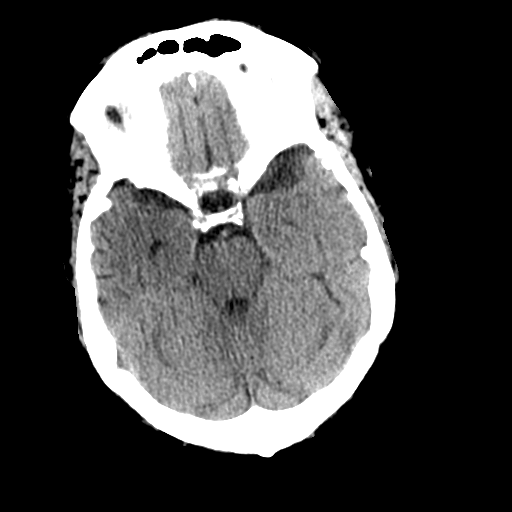
[im 11/31  brain]
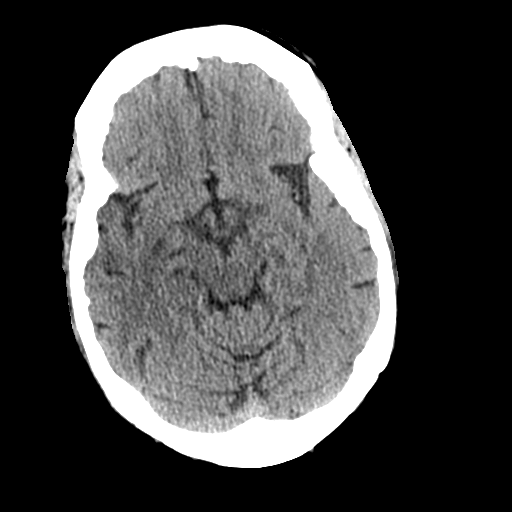
[im 14/31  brain]
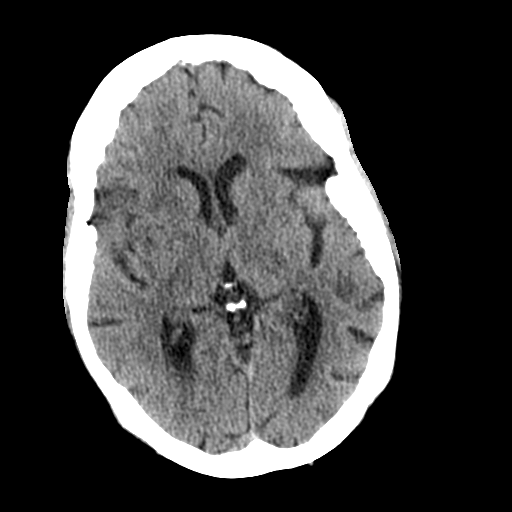
[im 14/31  bone]
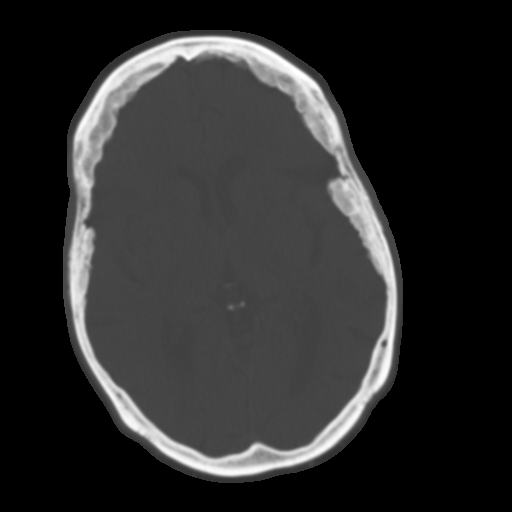
[im 17/31  brain]
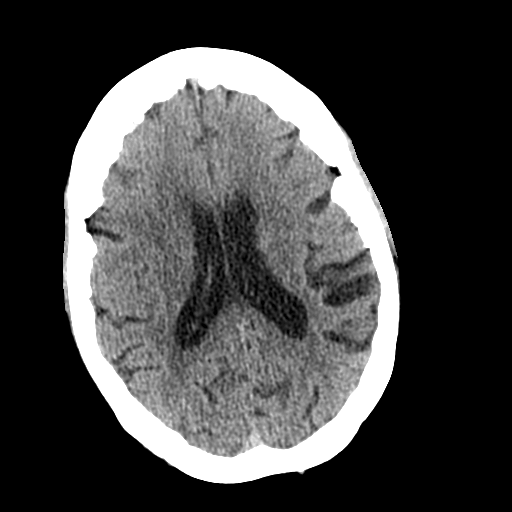
[im 20/31  brain]
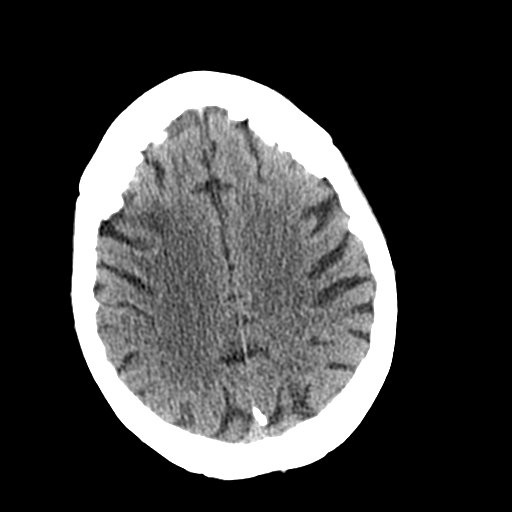
[im 23/31  brain]
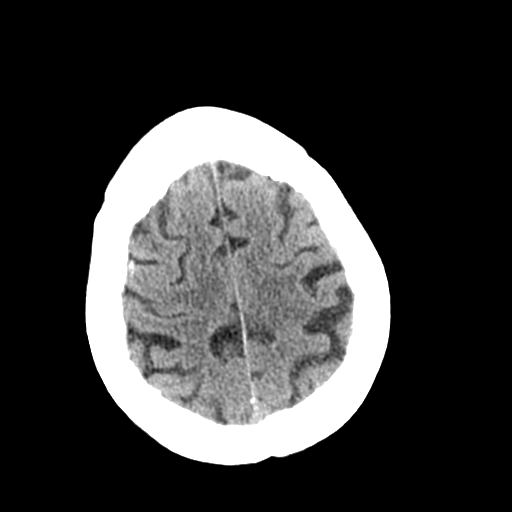
[im 25/31  brain]
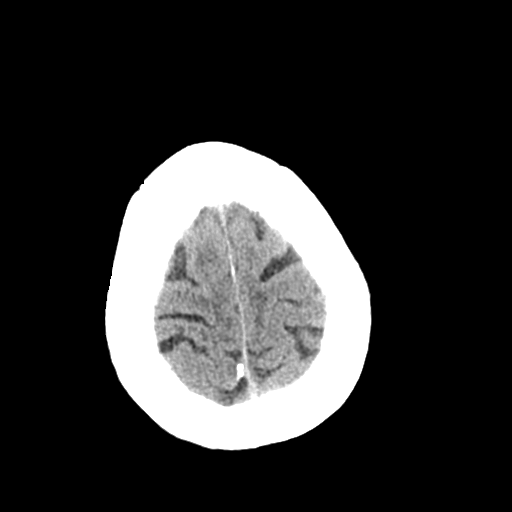
[im 25/31  bone]
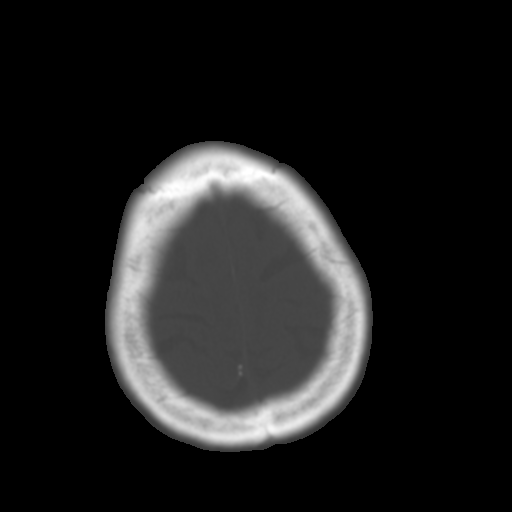
[im 28/31  brain]
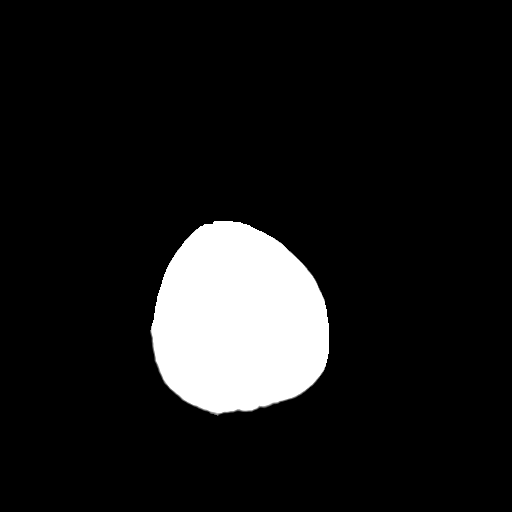

[Series 5: coronal soft tissue · coronal · 0.29mm/px · 3 of 62 slices shown]
[im 21/62  brain]
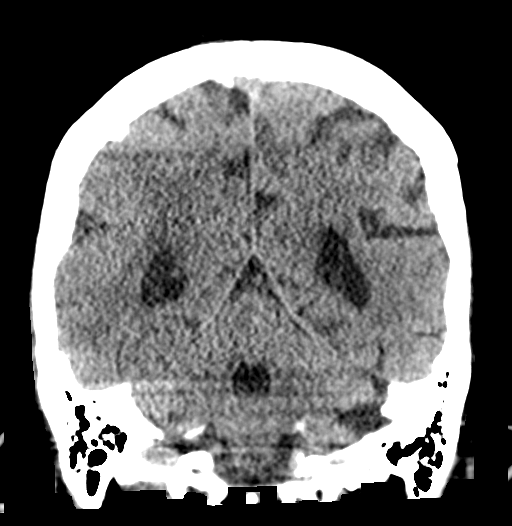
[im 28/62  brain]
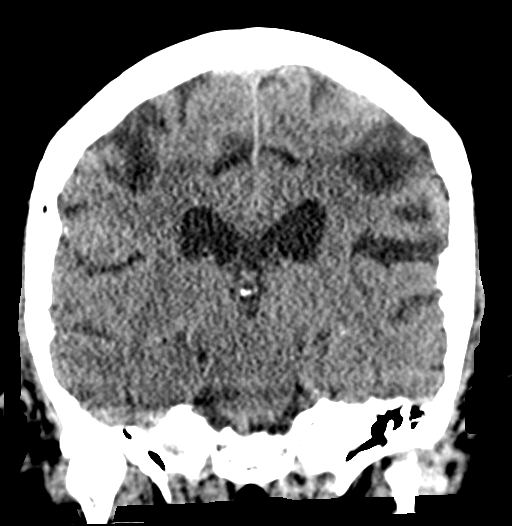
[im 34/62  brain]
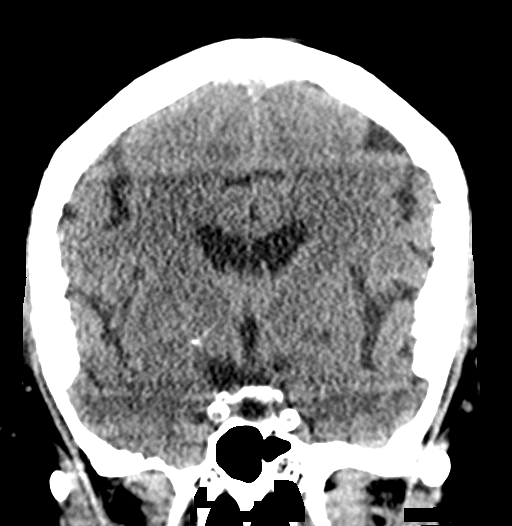

[Series 6: sagittal soft tissue · sagittal · 0.30mm/px · 3 of 51 slices shown]
[im 17/51  brain]
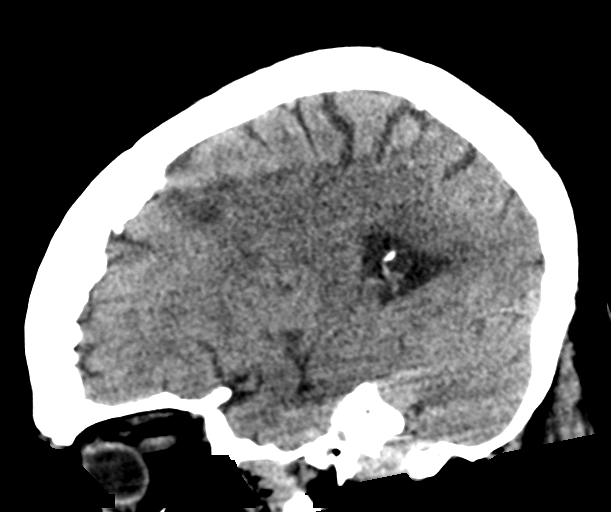
[im 26/51  brain]
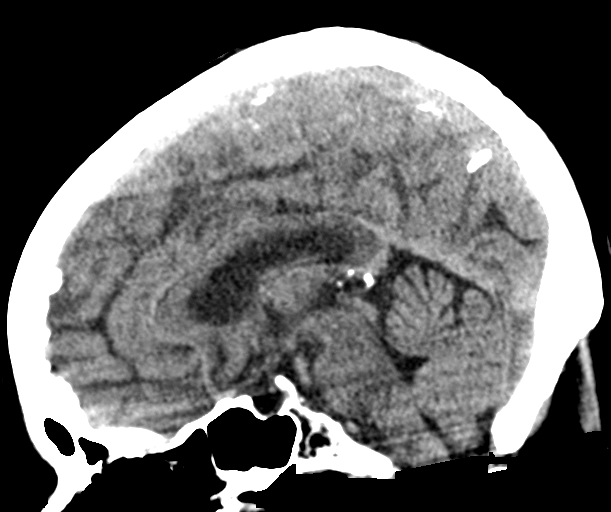
[im 34/51  brain]
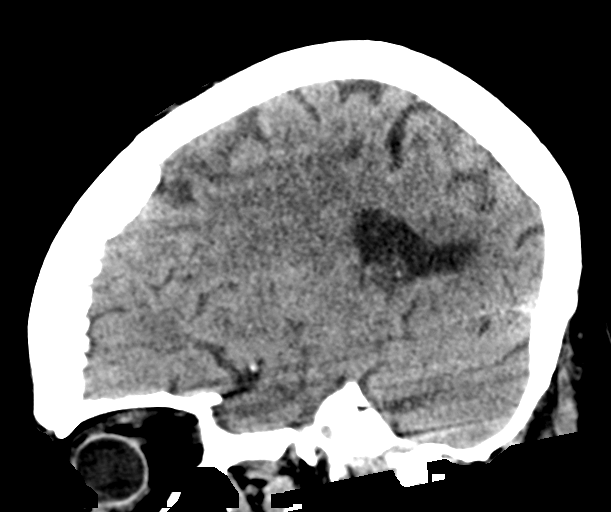

[16 of 47 positions shown; findings below may reference images not displayed]

FINDINGS: Brain: No evidence of acute large vascular territory infarction,
hemorrhage, hydrocephalus, extra-axial collection or mass
lesion/mass effect. Chronic encephalomalacia in the right frontal
lobe, similar to prior. Similar generalized cerebral atrophy with
volume loss. Patchy white matter hypodensities, which are
nonspecific but most likely relate to chronic microvascular ischemic
disease.

Vascular: Calcific atherosclerosis.

Skull: Left frontal/periorbital contusion/laceration. No acute
fracture. Hyperostosis frontalis.

Sinuses/Orbits: Visualized sinuses are clear.  Unremarkable orbits.

Other: No mastoid effusions.
IMPRESSION: 1. No evidence of acute intracranial abnormality.
2. Left frontal/periorbital contusion/laceration without acute
fracture.
3. Chronic microvascular ischemic disease, remote right frontal
infarct, and generalized cerebral atrophy.

## 2023-05-05 DEATH — deceased
